# Patient Record
Sex: Female | Born: 1955 | Race: Black or African American | Hispanic: No | Marital: Married | Smoking: Never smoker
Health system: Southern US, Community
[De-identification: ages and names within clinical notes are randomized; demographics above are authoritative.]

## PROBLEM LIST (undated history)

## (undated) DIAGNOSIS — L309 Dermatitis, unspecified: Secondary | ICD-10-CM

## (undated) DIAGNOSIS — I10 Essential (primary) hypertension: Secondary | ICD-10-CM

## (undated) DIAGNOSIS — T7840XA Allergy, unspecified, initial encounter: Secondary | ICD-10-CM

## (undated) DIAGNOSIS — N938 Other specified abnormal uterine and vaginal bleeding: Secondary | ICD-10-CM

## (undated) DIAGNOSIS — E079 Disorder of thyroid, unspecified: Secondary | ICD-10-CM

## (undated) DIAGNOSIS — M810 Age-related osteoporosis without current pathological fracture: Secondary | ICD-10-CM

## (undated) DIAGNOSIS — M1990 Unspecified osteoarthritis, unspecified site: Secondary | ICD-10-CM

## (undated) HISTORY — DX: Dermatitis, unspecified: L30.9

## (undated) HISTORY — DX: Age-related osteoporosis without current pathological fracture: M81.0

## (undated) HISTORY — PX: TONSILLECTOMY: SHX5217

## (undated) HISTORY — DX: Other specified abnormal uterine and vaginal bleeding: N93.8

## (undated) HISTORY — PX: TUBAL LIGATION: SHX77

## (undated) HISTORY — DX: Unspecified osteoarthritis, unspecified site: M19.90

## (undated) HISTORY — DX: Allergy, unspecified, initial encounter: T78.40XA

---

## 1995-08-26 HISTORY — PX: TUBAL LIGATION: SHX77

## 1997-12-07 ENCOUNTER — Other Ambulatory Visit: Admission: RE | Admit: 1997-12-07 | Discharge: 1997-12-07 | Payer: Self-pay | Admitting: Emergency Medicine

## 1998-06-29 ENCOUNTER — Other Ambulatory Visit: Admission: RE | Admit: 1998-06-29 | Discharge: 1998-06-29 | Payer: Self-pay | Admitting: Family Medicine

## 1998-08-20 ENCOUNTER — Ambulatory Visit (HOSPITAL_COMMUNITY): Admission: RE | Admit: 1998-08-20 | Discharge: 1998-08-20 | Payer: Self-pay | Admitting: Family Medicine

## 1998-08-20 ENCOUNTER — Encounter: Payer: Self-pay | Admitting: Family Medicine

## 1999-08-28 ENCOUNTER — Encounter: Payer: Self-pay | Admitting: Family Medicine

## 1999-08-28 ENCOUNTER — Ambulatory Visit (HOSPITAL_COMMUNITY): Admission: RE | Admit: 1999-08-28 | Discharge: 1999-08-28 | Payer: Self-pay | Admitting: Family Medicine

## 2000-04-30 ENCOUNTER — Other Ambulatory Visit: Admission: RE | Admit: 2000-04-30 | Discharge: 2000-04-30 | Payer: Self-pay | Admitting: Family Medicine

## 2000-06-30 ENCOUNTER — Encounter: Admission: RE | Admit: 2000-06-30 | Discharge: 2000-06-30 | Payer: Self-pay | Admitting: Urology

## 2000-06-30 ENCOUNTER — Encounter: Payer: Self-pay | Admitting: Urology

## 2000-09-04 ENCOUNTER — Ambulatory Visit (HOSPITAL_COMMUNITY): Admission: RE | Admit: 2000-09-04 | Discharge: 2000-09-04 | Payer: Self-pay | Admitting: Family Medicine

## 2000-09-04 ENCOUNTER — Encounter: Payer: Self-pay | Admitting: Family Medicine

## 2001-09-13 ENCOUNTER — Encounter: Payer: Self-pay | Admitting: Family Medicine

## 2001-09-13 ENCOUNTER — Ambulatory Visit (HOSPITAL_COMMUNITY): Admission: RE | Admit: 2001-09-13 | Discharge: 2001-09-13 | Payer: Self-pay | Admitting: Family Medicine

## 2002-09-14 ENCOUNTER — Ambulatory Visit (HOSPITAL_COMMUNITY): Admission: RE | Admit: 2002-09-14 | Discharge: 2002-09-14 | Payer: Self-pay | Admitting: Family Medicine

## 2002-09-14 ENCOUNTER — Encounter: Payer: Self-pay | Admitting: Family Medicine

## 2003-09-18 ENCOUNTER — Ambulatory Visit (HOSPITAL_COMMUNITY): Admission: RE | Admit: 2003-09-18 | Discharge: 2003-09-18 | Payer: Self-pay | Admitting: Family Medicine

## 2004-08-25 HISTORY — PX: OTHER SURGICAL HISTORY: SHX169

## 2004-08-25 LAB — HM COLONOSCOPY: HM Colonoscopy: NORMAL

## 2004-09-18 ENCOUNTER — Ambulatory Visit (HOSPITAL_COMMUNITY): Admission: RE | Admit: 2004-09-18 | Discharge: 2004-09-18 | Payer: Self-pay | Admitting: Family Medicine

## 2004-10-08 ENCOUNTER — Other Ambulatory Visit: Admission: RE | Admit: 2004-10-08 | Discharge: 2004-10-08 | Payer: Self-pay | Admitting: Obstetrics and Gynecology

## 2005-02-18 ENCOUNTER — Encounter (INDEPENDENT_AMBULATORY_CARE_PROVIDER_SITE_OTHER): Payer: Self-pay | Admitting: *Deleted

## 2005-02-18 ENCOUNTER — Inpatient Hospital Stay (HOSPITAL_COMMUNITY): Admission: RE | Admit: 2005-02-18 | Discharge: 2005-02-20 | Payer: Self-pay | Admitting: Obstetrics and Gynecology

## 2005-09-22 ENCOUNTER — Ambulatory Visit (HOSPITAL_COMMUNITY): Admission: RE | Admit: 2005-09-22 | Discharge: 2005-09-22 | Payer: Self-pay | Admitting: Obstetrics and Gynecology

## 2005-10-15 ENCOUNTER — Other Ambulatory Visit: Admission: RE | Admit: 2005-10-15 | Discharge: 2005-10-15 | Payer: Self-pay | Admitting: Obstetrics and Gynecology

## 2006-08-25 HISTORY — PX: OTHER SURGICAL HISTORY: SHX169

## 2006-09-23 ENCOUNTER — Ambulatory Visit (HOSPITAL_COMMUNITY): Admission: RE | Admit: 2006-09-23 | Discharge: 2006-09-23 | Payer: Self-pay | Admitting: Obstetrics and Gynecology

## 2007-02-01 ENCOUNTER — Ambulatory Visit: Admission: RE | Admit: 2007-02-01 | Discharge: 2007-03-02 | Payer: Self-pay | Admitting: Radiation Oncology

## 2007-02-08 ENCOUNTER — Encounter (INDEPENDENT_AMBULATORY_CARE_PROVIDER_SITE_OTHER): Payer: Self-pay | Admitting: Specialist

## 2007-02-08 ENCOUNTER — Ambulatory Visit (HOSPITAL_BASED_OUTPATIENT_CLINIC_OR_DEPARTMENT_OTHER): Admission: RE | Admit: 2007-02-08 | Discharge: 2007-02-08 | Payer: Self-pay | Admitting: Specialist

## 2007-05-26 ENCOUNTER — Emergency Department (HOSPITAL_COMMUNITY): Admission: EM | Admit: 2007-05-26 | Discharge: 2007-05-26 | Payer: Self-pay | Admitting: Emergency Medicine

## 2007-06-03 ENCOUNTER — Ambulatory Visit: Payer: Self-pay | Admitting: Cardiology

## 2007-09-28 ENCOUNTER — Ambulatory Visit (HOSPITAL_COMMUNITY): Admission: RE | Admit: 2007-09-28 | Discharge: 2007-09-28 | Payer: Self-pay | Admitting: Obstetrics and Gynecology

## 2007-11-05 ENCOUNTER — Ambulatory Visit: Payer: Self-pay | Admitting: Family Medicine

## 2007-11-05 DIAGNOSIS — E039 Hypothyroidism, unspecified: Secondary | ICD-10-CM | POA: Insufficient documentation

## 2007-11-05 DIAGNOSIS — H538 Other visual disturbances: Secondary | ICD-10-CM | POA: Insufficient documentation

## 2007-11-05 DIAGNOSIS — J069 Acute upper respiratory infection, unspecified: Secondary | ICD-10-CM | POA: Insufficient documentation

## 2007-11-05 LAB — CONVERTED CEMR LAB
Albumin: 4 g/dL (ref 3.5–5.2)
Basophils Relative: 0 % (ref 0–1)
Creatinine, Ser: 1.03 mg/dL (ref 0.40–1.20)
Eosinophils Absolute: 0 10*3/uL (ref 0.0–0.7)
Eosinophils Relative: 0 % (ref 0–5)
HCT: 41.7 % (ref 36.0–46.0)
HDL: 59 mg/dL (ref >39)
Indirect Bilirubin: 0.2 mg/dL (ref 0.0–0.9)
MCHC: 33.1 g/dL (ref 30.0–36.0)
MCV: 81.9 fL (ref 78.0–100.0)
Monocytes Absolute: 0.7 10*3/uL (ref 0.1–1.0)
Potassium: 3.6 meq/L (ref 3.5–5.3)
RBC: 5.09 M/uL (ref 3.87–5.11)
RDW: 15.7 % — ABNORMAL HIGH (ref 11.5–15.5)
Sodium: 138 meq/L (ref 135–145)
Total Bilirubin: 0.3 mg/dL (ref 0.3–1.2)
VLDL: 18 mg/dL (ref 0–40)

## 2007-11-16 ENCOUNTER — Telehealth: Payer: Self-pay | Admitting: Family Medicine

## 2007-11-22 ENCOUNTER — Telehealth: Payer: Self-pay | Admitting: Family Medicine

## 2007-12-31 ENCOUNTER — Telehealth (INDEPENDENT_AMBULATORY_CARE_PROVIDER_SITE_OTHER): Payer: Self-pay | Admitting: *Deleted

## 2008-01-26 ENCOUNTER — Ambulatory Visit: Payer: Self-pay | Admitting: Family Medicine

## 2008-02-11 ENCOUNTER — Emergency Department (HOSPITAL_COMMUNITY): Admission: EM | Admit: 2008-02-11 | Discharge: 2008-02-11 | Payer: Self-pay | Admitting: Emergency Medicine

## 2008-02-17 ENCOUNTER — Telehealth: Payer: Self-pay | Admitting: *Deleted

## 2008-05-09 ENCOUNTER — Ambulatory Visit: Payer: Self-pay | Admitting: Family Medicine

## 2008-05-09 DIAGNOSIS — R059 Cough, unspecified: Secondary | ICD-10-CM | POA: Insufficient documentation

## 2008-05-09 DIAGNOSIS — R05 Cough: Secondary | ICD-10-CM

## 2008-05-17 DIAGNOSIS — N938 Other specified abnormal uterine and vaginal bleeding: Secondary | ICD-10-CM | POA: Insufficient documentation

## 2008-05-17 DIAGNOSIS — N949 Unspecified condition associated with female genital organs and menstrual cycle: Secondary | ICD-10-CM

## 2008-05-23 ENCOUNTER — Ambulatory Visit: Payer: Self-pay | Admitting: Family Medicine

## 2008-05-29 ENCOUNTER — Telehealth: Payer: Self-pay | Admitting: Family Medicine

## 2008-05-29 ENCOUNTER — Ambulatory Visit: Payer: Self-pay | Admitting: Family Medicine

## 2008-06-01 ENCOUNTER — Telehealth: Payer: Self-pay | Admitting: Family Medicine

## 2008-06-05 ENCOUNTER — Telehealth: Payer: Self-pay | Admitting: Family Medicine

## 2008-06-08 ENCOUNTER — Ambulatory Visit: Payer: Self-pay | Admitting: Family Medicine

## 2008-06-08 DIAGNOSIS — J309 Allergic rhinitis, unspecified: Secondary | ICD-10-CM | POA: Insufficient documentation

## 2008-06-08 DIAGNOSIS — J45909 Unspecified asthma, uncomplicated: Secondary | ICD-10-CM | POA: Insufficient documentation

## 2008-07-31 ENCOUNTER — Ambulatory Visit: Payer: Self-pay | Admitting: Family Medicine

## 2008-08-04 ENCOUNTER — Ambulatory Visit: Payer: Self-pay | Admitting: Family Medicine

## 2008-08-04 DIAGNOSIS — L509 Urticaria, unspecified: Secondary | ICD-10-CM | POA: Insufficient documentation

## 2008-08-29 ENCOUNTER — Ambulatory Visit: Payer: Self-pay | Admitting: Family Medicine

## 2008-08-29 DIAGNOSIS — R5381 Other malaise: Secondary | ICD-10-CM | POA: Insufficient documentation

## 2008-08-29 DIAGNOSIS — R5383 Other fatigue: Secondary | ICD-10-CM | POA: Insufficient documentation

## 2008-08-29 DIAGNOSIS — I10 Essential (primary) hypertension: Secondary | ICD-10-CM | POA: Insufficient documentation

## 2008-09-28 ENCOUNTER — Ambulatory Visit (HOSPITAL_COMMUNITY): Admission: RE | Admit: 2008-09-28 | Discharge: 2008-09-28 | Payer: Self-pay | Admitting: Obstetrics and Gynecology

## 2008-10-03 ENCOUNTER — Telehealth: Payer: Self-pay | Admitting: Family Medicine

## 2008-11-06 ENCOUNTER — Ambulatory Visit: Payer: Self-pay | Admitting: Family Medicine

## 2008-11-06 LAB — CONVERTED CEMR LAB
Alkaline Phosphatase: 60 units/L (ref 39–117)
BUN: 10 mg/dL (ref 6–23)
CO2: 26 meq/L (ref 19–32)
Calcium: 8.8 mg/dL (ref 8.4–10.5)
Cholesterol: 171 mg/dL (ref 0–200)
Creatinine, Ser: 0.8 mg/dL (ref 0.4–1.2)
Glucose, Bld: 81 mg/dL (ref 70–99)
HDL: 61.4 mg/dL (ref >39.0)
LDL Cholesterol: 92 mg/dL (ref 0–99)
Lymphocytes Relative: 34.2 % (ref 12.0–46.0)
Monocytes Relative: 10.5 % (ref 3.0–12.0)
Neutro Abs: 2.6 10*3/uL (ref 1.4–7.7)
Neutrophils Relative %: 53.2 % (ref 43.0–77.0)
Potassium: 3.2 meq/L — ABNORMAL LOW (ref 3.5–5.1)
RBC: 4.19 M/uL (ref 3.87–5.11)
RDW: 14.5 % (ref 11.5–14.6)
Sodium: 135 meq/L (ref 135–145)
TSH: 1.81 microintl units/mL (ref 0.35–5.50)
Triglycerides: 88 mg/dL (ref 0–149)
VLDL: 18 mg/dL (ref 0–40)
WBC: 4.8 10*3/uL (ref 4.5–10.5)

## 2008-11-13 ENCOUNTER — Ambulatory Visit: Payer: Self-pay | Admitting: Family Medicine

## 2008-12-07 ENCOUNTER — Telehealth: Payer: Self-pay | Admitting: Family Medicine

## 2008-12-14 ENCOUNTER — Ambulatory Visit: Payer: Self-pay | Admitting: Family Medicine

## 2009-02-02 ENCOUNTER — Ambulatory Visit: Payer: Self-pay | Admitting: Family Medicine

## 2009-02-02 DIAGNOSIS — R51 Headache: Secondary | ICD-10-CM | POA: Insufficient documentation

## 2009-02-02 DIAGNOSIS — R519 Headache, unspecified: Secondary | ICD-10-CM | POA: Insufficient documentation

## 2009-02-09 ENCOUNTER — Telehealth (INDEPENDENT_AMBULATORY_CARE_PROVIDER_SITE_OTHER): Payer: Self-pay | Admitting: *Deleted

## 2009-02-12 ENCOUNTER — Ambulatory Visit: Payer: Self-pay | Admitting: Family Medicine

## 2009-02-14 LAB — CONVERTED CEMR LAB
BUN: 12 mg/dL (ref 6–23)
CO2: 28 meq/L (ref 19–32)
Chloride: 105 meq/L (ref 96–112)
Creatinine, Ser: 0.8 mg/dL (ref 0.4–1.2)
GFR calc non Af Amer: 97.32 mL/min (ref >60)
Glucose, Bld: 90 mg/dL (ref 70–99)
Sodium: 140 meq/L (ref 135–145)

## 2009-03-08 ENCOUNTER — Ambulatory Visit: Payer: Self-pay | Admitting: Family Medicine

## 2009-03-30 ENCOUNTER — Telehealth: Payer: Self-pay | Admitting: Family Medicine

## 2009-08-08 ENCOUNTER — Telehealth: Payer: Self-pay | Admitting: Family Medicine

## 2009-10-02 ENCOUNTER — Ambulatory Visit (HOSPITAL_COMMUNITY): Admission: RE | Admit: 2009-10-02 | Discharge: 2009-10-02 | Payer: Self-pay | Admitting: Obstetrics and Gynecology

## 2009-10-09 ENCOUNTER — Encounter: Payer: Self-pay | Admitting: *Deleted

## 2009-11-08 ENCOUNTER — Telehealth: Payer: Self-pay | Admitting: Family Medicine

## 2009-11-14 ENCOUNTER — Ambulatory Visit: Payer: Self-pay | Admitting: Family Medicine

## 2009-11-14 LAB — CONVERTED CEMR LAB
ALT: 12 units/L (ref 0–35)
BUN: 9 mg/dL (ref 6–23)
Basophils Absolute: 0 10*3/uL (ref 0.0–0.1)
Basophils Relative: 0.3 % (ref 0.0–3.0)
Bilirubin Urine: NEGATIVE
Bilirubin, Direct: 0.1 mg/dL (ref 0.0–0.3)
Blood in Urine, dipstick: NEGATIVE
Calcium: 8.7 mg/dL (ref 8.4–10.5)
Cholesterol: 155 mg/dL (ref 0–200)
Eosinophils Absolute: 0.1 10*3/uL (ref 0.0–0.7)
Eosinophils Relative: 1 % (ref 0.0–5.0)
GFR calc non Af Amer: 113.19 mL/min (ref >60)
HCT: 34.3 % — ABNORMAL LOW (ref 36.0–46.0)
HDL: 67.8 mg/dL (ref >39.00)
Hemoglobin: 11.7 g/dL — ABNORMAL LOW (ref 12.0–15.0)
LDL Cholesterol: 75 mg/dL (ref 0–99)
Lymphs Abs: 2 10*3/uL (ref 0.7–4.0)
MCV: 88.1 fL (ref 78.0–100.0)
Neutro Abs: 3.8 10*3/uL (ref 1.4–7.7)
Neutrophils Relative %: 60 % (ref 43.0–77.0)
Nitrite: NEGATIVE
Specific Gravity, Urine: <1.005
Total Protein: 6.7 g/dL (ref 6.0–8.3)
Urobilinogen, UA: 0.2
WBC Urine, dipstick: NEGATIVE
WBC: 6.4 10*3/uL (ref 4.5–10.5)
pH: 7

## 2009-11-30 ENCOUNTER — Ambulatory Visit: Payer: Self-pay | Admitting: Family Medicine

## 2009-12-06 ENCOUNTER — Ambulatory Visit: Payer: Self-pay | Admitting: Internal Medicine

## 2009-12-17 ENCOUNTER — Telehealth: Payer: Self-pay | Admitting: Family Medicine

## 2010-01-28 ENCOUNTER — Telehealth: Payer: Self-pay | Admitting: Family Medicine

## 2010-06-06 ENCOUNTER — Ambulatory Visit: Payer: Self-pay | Admitting: Family Medicine
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
## 2010-07-26 ENCOUNTER — Ambulatory Visit: Payer: Self-pay | Admitting: Family Medicine

## 2010-08-02 ENCOUNTER — Encounter: Payer: Self-pay | Admitting: Cardiovascular Disease

## 2010-09-06 ENCOUNTER — Encounter
Admission: RE | Admit: 2010-09-06 | Discharge: 2010-09-06 | Payer: Self-pay | Source: Home / Self Care | Attending: Obstetrics and Gynecology | Admitting: Obstetrics and Gynecology

## 2010-09-26 NOTE — Progress Notes (Signed)
Summary: eye md?  Phone Note Call from Patient   Caller: Patient Call For: Roderick Pee MD Summary of Call: Pt wants a referral to a Opthmalogy today.  EYE is red and swollen.  Can we refer her? [phone] Initial call taken by: Lynann Beaver CMA,  December 17, 2009 9:51 AM  Follow-up for Phone Call        tim bevis Follow-up by: Roderick Pee MD,  December 17, 2009 10:01 AM  Additional Follow-up for Phone Call Additional follow up Details #1::        Pt. notified. Additional Follow-up by: Lynann Beaver CMA,  December 17, 2009 10:38 AM

## 2010-09-26 NOTE — Miscellaneous (Signed)
Summary: flu vaccine  Clinical Lists Changes  Observations: Added new observation of FLU VAX: Historical (10/08/2009 10:50)      Immunization History:  Influenza Immunization History:    Influenza:  historical (10/08/2009)

## 2010-09-26 NOTE — Letter (Signed)
Summary: Out of Work  [facility]  [address] [place], [place] [address]   Phone: [phone]  Fax: [phone]    December 06, 2009   Employee:  Jamie Mayer    To Whom It May Concern:   For Medical reasons, please excuse the above named employee from work for the following dates:  Start:   12/06/09  End:   12/10/09  If you need additional information, please feel free to contact our office.         Sincerely,    Etta Grandchild MD

## 2010-09-26 NOTE — Assessment & Plan Note (Signed)
Summary: ha/njr   Vital Signs:  Patient profile:   55 year old female O2 Sat:      99 % Temp:     98.6 degrees F Pulse rate:   73 / minute BP sitting:   120 / 80  (left arm)  Vitals Entered By: Pura Spice, RN (July 26, 2010 4:26 PM) CC: headache x 4 days feels like pressure   "air " in head and c/o feels like acid releasing  in stomach "    History of Present Illness: Here for 4 days of constant HAs which are centered around both eyes and the forehead. She has migraines, but Zomig has not been helping these HAs. She was nauseated a few days ago but not today. No vision changes or other neurologic changes. Also she has had pressure in the sinuses beneath her eyes for a week. No fever or ST or cough.      Allergies (verified): No Known Drug Allergies  Past History:  Past Medical History: Reviewed history from 02/02/2009 and no changes required. childbirth x 2 children are 16, and 29 BTL tonsillectomya fibroid removed keloids, treated with radiation therapy.  It was a scar from previous C-section colonoscopy for evaluation of rectal bleeding was negative and 2008 Allergic rhinitis Asthma DUB Headache  migraine pre. menst  Past Surgical History: Reviewed history from 12/06/2009 and no changes required. Denies surgical history  Review of Systems  The patient denies anorexia, fever, weight loss, weight gain, vision loss, decreased hearing, hoarseness, chest pain, syncope, dyspnea on exertion, peripheral edema, prolonged cough, hemoptysis, abdominal pain, melena, hematochezia, severe indigestion/heartburn, hematuria, incontinence, genital sores, muscle weakness, suspicious skin lesions, transient blindness, difficulty walking, depression, unusual weight change, abnormal bleeding, enlarged lymph nodes, angioedema, breast masses, and testicular masses.    Physical Exam  General:  Well-developed,well-nourished,in no acute distress; alert,appropriate and cooperative  throughout examination Head:  Normocephalic and atraumatic without obvious abnormalities. No apparent alopecia or balding. Eyes:  No corneal or conjunctival inflammation noted. EOMI. Perrla. Funduscopic exam benign, without hemorrhages, exudates or papilledema. Vision grossly normal. Ears:  External ear exam shows no significant lesions or deformities.  Otoscopic examination reveals clear canals, tympanic membranes are intact bilaterally without bulging, retraction, inflammation or discharge. Hearing is grossly normal bilaterally. Nose:  External nasal examination shows no deformity or inflammation. Nasal mucosa are pink and moist without lesions or exudates. Mouth:  Oral mucosa and oropharynx without lesions or exudates.  Teeth in good repair. Neck:  No deformities, masses, or tenderness noted. Chest Wall:  No deformities, masses, or tenderness noted. Lungs:  Normal respiratory effort, chest expands symmetrically. Lungs are clear to auscultation, no crackles or wheezes. Heart:  Normal rate and regular rhythm. S1 and S2 normal without gallop, murmur, click, rub or other extra sounds. Neurologic:  alert & oriented X3, cranial nerves II-XII intact, and gait normal.     Impression & Recommendations:  Problem # 1:  HEADACHE (ICD-784.0)  Her updated medication list for this problem includes:    Zomig Zmt 2.5 Mg Tbdp (Zolmitriptan) .Marland Kitchen... 1 sl stat @ onset of migraine    Vicodin 5-500 Mg Tabs (Hydrocodone-acetaminophen) .Marland Kitchen... 1 q 6 hours as needed pain  Problem # 2:  ALLERGIC RHINITIS (ICD-477.9)  Her updated medication list for this problem includes:    Flonase 50 Mcg/act Susp (Fluticasone propionate) .Marland Kitchen... 2 sprays each nostril once daily  Complete Medication List: 1)  Synthroid 88 Mcg Tabs (Levothyroxine sodium) .... One tab once daily  2)  Liquid Calcium/vitamins A & D 200-275-60 Mg-unt-unt Caps (Calcium-vitamin a-vitamin d) .... Once daily 3)  Lovaza 1 Gm Caps (Omega-3-acid ethyl esters)  .... Take one tab three times a day 4)  Lisinopril 40 Mg Tabs (Lisinopril) .Marland Kitchen.. 1 &1/2 qam 5)  Potassium 75 Mg Tabs (Potassium) .... Take 1 tablet by mouth two times a day 6)  Zomig Zmt 2.5 Mg Tbdp (Zolmitriptan) .Marland Kitchen.. 1 sl stat @ onset of migraine 7)  Hydrochlorothiazide 12.5 Mg Tabs (Hydrochlorothiazide) .... Take 1 tablet by mouth every morning 8)  Boniva 150 Mg Tabs (Ibandronate sodium) .... One tab every month 9)  Tobradex 0.3-0.1 % Oint (Tobramycin-dexamethasone) .... 2 gtts in each eye three times a day for 5 days then stop 10)  Hydromet 5-1.5 Mg/67ml Syrp (Hydrocodone-homatropine) .... 1/2 to 1 tsp at bedtime as needed 11)  Flonase 50 Mcg/act Susp (Fluticasone propionate) .... 2 sprays each nostril once daily 12)  Vicodin 5-500 Mg Tabs (Hydrocodone-acetaminophen) .Marland Kitchen.. 1 q 6 hours as needed pain  Patient Instructions: 1)  Use Mucinex and Flonase to reduce sinus congestion. These seem to be triggering a chronic migraine pattern. Add Vicodin as a rescue pain med if Zomig fails.  2)  Please schedule a follow-up appointment as needed .  Prescriptions: VICODIN 5-500 MG TABS (HYDROCODONE-ACETAMINOPHEN) 1 q 6 hours as needed pain  #30 x 0   Entered and Authorized by:   Nelwyn Salisbury MD   Signed by:   Nelwyn Salisbury MD on 07/26/2010   Method used:   Print then Give to Patient   RxID:   [id number] FLONASE 50 MCG/ACT SUSP (FLUTICASONE PROPIONATE) 2 sprays each nostril once daily  #30 x 11   Entered and Authorized by:   Nelwyn Salisbury MD   Signed by:   Nelwyn Salisbury MD on 07/26/2010   Method used:   Print then Give to Patient   RxID:   [id number]    Orders Added: 1)  Est. Patient Level IV [64[address]]

## 2010-09-26 NOTE — Assessment & Plan Note (Signed)
Summary: cough/njr   Vital Signs:  Patient profile:   55 year old female Weight:      169 pounds Pulse rate:   72 / minute Pulse rhythm:   regular BP sitting:   110 / 80  Vitals Entered By: Lynann Beaver CMA (June 06, 2010 4:02 PM) CC: cough Is Patient Diabetic? No Pain Assessment Patient in pain? no        Primary Care Provider:  Roderick Pee MD  CC:  cough.  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  Review of systems negative  Current Medications (verified): 1)  Synthroid 88 Mcg Tabs (Levothyroxine Sodium) .... One Tab Once Daily 2)  Liquid Calcium/vitamins A & D 200-275-60 Mg-Unt-Unt Caps (Calcium-Vitamin A-Vitamin D) .... Once Daily 3)  Lovaza 1 Gm Caps (Omega-3-Acid Ethyl Esters) .... Take One Tab Three Times A Day 4)  Lisinopril 40 Mg Tabs (Lisinopril) .Marland Kitchen.. 1 &1/2 Qam 5)  Potassium 75 Mg Tabs (Potassium) .... Take 1 Tablet By Mouth Two Times A Day 6)  Zomig Zmt 2.5 Mg Tbdp (Zolmitriptan) .Marland Kitchen.. 1 Sl Stat @ Onset of Migraine 7)  Hydrochlorothiazide 12.5 Mg Tabs (Hydrochlorothiazide) .... Take 1 Tablet By Mouth Every Morning 8)  Boniva 150 Mg Tabs (Ibandronate Sodium) .... One Tab Every Month 9)  Tobradex 0.3-0.1 % Oint (Tobramycin-Dexamethasone) .... 2 Gtts in Each Eye Three Times A Day For 5 Days Then Stop  Allergies (verified): No Known Drug Allergies  Past History:  Past medical, surgical, family and social histories (including risk factors) reviewed for relevance to current acute and chronic problems.  Past Medical History: Reviewed history from 02/02/2009 and no changes required. childbirth x 2 children are 17, and 29 BTL tonsillectomya fibroid removed keloids, treated with radiation therapy.  It was a scar from previous C-section colonoscopy for evaluation of rectal bleeding was negative and 2008 Allergic  rhinitis Asthma DUB Headache  migraine pre. menst  Past Surgical History: Reviewed history from 12/06/2009 and no changes required. Denies surgical history  Family History: Reviewed history from 11/05/2007 and no changes required. mother died at 21, renal failure from hypertension father died at 41, alcoholism  one brother are alcoholic and hypertensivetwo sisters, both hypertensive  Social History: Reviewed history from 11/05/2007 and no changes required. 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 No apparent alopecia or balding. Eyes:  No corneal or conjunctival inflammation noted. EOMI. Perrla. Funduscopic exam benign, without hemorrhages, exudates or papilledema. Vision grossly normal. Ears:  External ear exam shows no significant lesions or deformities.  Otoscopic examination reveals clear canals, tympanic membranes are intact bilaterally without bulging, retraction, inflammation or discharge. Hearing is grossly normal bilaterally. Nose:  External nasal examination shows no deformity or inflammation. Nasal mucosa are pink and moist without lesions or exudates. Mouth:  Oral mucosa and oropharynx without lesions or exudates.  Teeth in good repair. Neck:  No deformities, masses, or tenderness noted. Chest Wall:  No deformities, masses, or tenderness noted. Lungs:  Normal respiratory effort, chest expands symmetrically. Lungs are clear to auscultation, no crackles or wheezes.   Problems:  Medical Problems Added: 1)  Dx of Viral Infection-unspec  (ICD-079.99)  Impression & Recommendations:  Problem # 1:  VIRAL INFECTION-UNSPEC (ICD-079.99) Assessment New  Her  updated medication list for this problem includes:    Hydromet 5-1.5 Mg/32ml Syrp (Hydrocodone-homatropine) .Marland Kitchen... 1/2 to 1 tsp at bedtime as needed  Complete Medication List: 1)  Synthroid 88 Mcg Tabs (Levothyroxine sodium) .... One tab once daily 2)  Liquid Calcium/vitamins A & D 200-275-60 Mg-unt-unt Caps (Calcium-vitamin a-vitamin d) .... Once daily 3)  Lovaza 1 Gm Caps (Omega-3-acid ethyl esters) .... Take one tab three times a day 4)  Lisinopril 40 Mg Tabs (Lisinopril) .Marland Kitchen.. 1 &1/2 qam 5)  Potassium 75 Mg Tabs (Potassium) .... Take 1 tablet by mouth two times a day 6)  Zomig Zmt 2.5 Mg Tbdp (Zolmitriptan) .Marland Kitchen.. 1 sl stat @ onset of migraine 7)  Hydrochlorothiazide 12.5 Mg Tabs (Hydrochlorothiazide) .... Take 1 tablet by mouth every morning 8)  Boniva 150 Mg Tabs (Ibandronate sodium) .... One tab every month 9)  Tobradex 0.3-0.1 % Oint (Tobramycin-dexamethasone) .... 2 gtts in each eye three times a day for 5 days then stop 10)  Hydromet 5-1.5 Mg/15ml Syrp (Hydrocodone-homatropine) .... 1/2 to 1 tsp at bedtime as needed  Patient Instructions: 1)  drink  20 ounces of water daily. 2)  Hydromet one half to 1 teaspoon at bedtime p.r.n. for cough and cold Prescriptions: HYDROMET 5-1.5 MG/5ML SYRP (HYDROCODONE-HOMATROPINE) 1/2 to 1 tsp at bedtime as needed  #8oz x 1   Entered and Authorized by:   Roderick Pee MD   Signed by:   Roderick Pee MD on 06/06/2010   Method used:   Print then Give to Patient   RxID:   [id number]

## 2010-09-26 NOTE — Assessment & Plan Note (Signed)
Summary: cpx/no pap/njr   Vital Signs:  Patient profile:   55 year old female Height:      63 inches Weight:      164 pounds BMI:     29.16 Temp:     98.1 degrees F oral BP sitting:   124 / 84  (left arm) Cuff size:   regular  Vitals Entered By: Kern Reap CMA Duncan Dull) (November 30, 2009 4:00 PM) CC: cpx Is Patient Diabetic? No Pain Assessment Patient in pain? no        CC:  cpx.  History of Present Illness: Jamie Mayer is a 55 year old, married female, nonsmoker, who comes in today for evaluation of multiple issues.  She takes Synthroid 88 micrograms daily for hypothyroidism.  TSH is normal.  Continue above does  She takes BCPs via her GYN.  Since she is over 50.  Advised to stop him.  She takes lisinopril 60 mg daily and hydrocortisone Zaditor .5 mg daily for hypertension.  BP 124/84.  She also takes Zomig.  Stat at the onset of a migraine.  She's had about 6 this year.  All of which have been promptly relieved by taking the Zomig immediately.  She gets routine eye care to care for his BSE monthly and gets any mammography.  Colonoscopy normal.  Tetanus 2009 seasonal flu shot 2011.    She also takes boniva for osteoporosis.  Advise should only take it for 3 years and then get off.  Allergies (verified): No Known Drug Allergies  Past History:  Past medical, surgical, family and social histories (including risk factors) reviewed, and no changes noted (except as noted below).  Past Medical History: Reviewed history from 02/02/2009 and no changes required. childbirth x 2 children are 17, and 29 BTL tonsillectomya fibroid removed keloids, treated with radiation therapy.  It was a scar from previous C-section colonoscopy for evaluation of rectal bleeding was negative and 2008 Allergic rhinitis Asthma DUB Headache  migraine pre. menst  Family History: Reviewed history from 11/05/2007 and no changes required. mother died at 12, renal failure from hypertension father  died at 39, alcoholism  one brother are alcoholic and hypertensivetwo sisters, both hypertensive  Social History: Reviewed history from 11/05/2007 and no changes required. 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 No apparent alopecia or balding. Eyes:  No corneal or conjunctival inflammation noted. EOMI. Perrla. Funduscopic exam benign, without hemorrhages, exudates or papilledema. Vision grossly normal. Ears:  External ear exam shows no significant lesions or deformities.  Otoscopic examination reveals clear canals, tympanic membranes are intact bilaterally without bulging, retraction, inflammation or discharge. Hearing is grossly normal bilaterally. Nose:  External nasal examination shows no deformity or inflammation. Nasal mucosa are pink and moist without lesions or exudates. Mouth:  Oral mucosa and oropharynx without lesions or exudates.  Teeth in good repair. Neck:  No deformities, masses, or tenderness noted. Chest Wall:  No deformities, masses, or tenderness noted. Breasts:  No mass, nodules, thickening, tenderness, bulging, retraction, inflamation, nipple discharge or skin changes noted.   Lungs:  Normal respiratory effort, chest expands symmetrically. Lungs are clear to auscultation, no crackles or wheezes. Heart:  Normal rate and regular rhythm. S1 and S2 normal without gallop, murmur, click, rub or other extra  sounds. Abdomen:  Bowel sounds positive,abdomen soft and non-tender without masses, organomegaly or hernias noted. Msk:  No deformity or scoliosis noted of thoracic or lumbar spine.   Pulses:  R and L carotid,radial,femoral,dorsalis pedis and posterior tibial  pulses are full and equal bilaterally Extremities:  No clubbing, cyanosis, edema, or deformity noted with normal full range of motion of all joints.   Neurologic:  No cranial nerve deficits noted. Station and gait are normal. Plantar reflexes are down-going bilaterally. DTRs are symmetrical throughout. Sensory, motor and coordinative functions appear intact. Skin:  total body skin exam normal, except she has some keloids.  Left chest and in the pubic area, where she had the fibroids removed from her uterus Cervical Nodes:  No lymphadenopathy noted Axillary Nodes:  No palpable lymphadenopathy Inguinal Nodes:  No significant adenopathy Psych:  Cognition and judgment appear intact. Alert and cooperative with normal attention span and concentration. No apparent delusions, illusions, hallucinations   Impression & Recommendations:  Problem # 1:  HEADACHE (ICD-784.0) Assessment Improved  Her updated medication list for this problem includes:    Adult Aspirin Ec Low Strength 81 Mg Tbec (Aspirin) ..... Once daily    Zomig Zmt 2.5 Mg Tbdp (Zolmitriptan) .Marland Kitchen... 1 sl stat @ onset of migraine  Orders: Prescription Created Electronically [phone])  Problem # 2:  HYPERTENSION NEC (ICD-997.91) Assessment: Improved  Orders: Prescription Created Electronically [id number]) EKG w/ Interpretation (93000)  Problem # 3:  ALLERGIC RHINITIS (ICD-477.9) Assessment: Improved  Orders: Prescription Created Electronically [id number])  Problem # 4:  UNSPECIFIED HYPOTHYROIDISM (ICD-244.9) Assessment: Improved  Her updated medication list for this problem includes:    Synthroid 88 Mcg Tabs (Levothyroxine sodium) ..... One tab once daily  Orders: Prescription Created Electronically [phone])  Problem # 5:  Preventive Health Care (ICD-V70.0) Assessment: Unchanged  Complete Medication List: 1)  Synthroid 88 Mcg Tabs (Levothyroxine sodium) .... One tab once daily 2)  Zovia 1/35e (28) 1-35 Mg-mcg Tabs (Ethynodiol  diac-eth estradiol) .... Uad 3)  Liquid Calcium/vitamins A & D 200-275-60 Mg-unt-unt Caps (Calcium-vitamin a-vitamin d) .... Once daily 4)  Lovaza 1 Gm Caps (Omega-3-acid ethyl esters) .... Take one tab three times a day 5)  Adult Aspirin Ec Low Strength 81 Mg Tbec (Aspirin) .... Once daily 6)  Lisinopril 40 Mg Tabs (Lisinopril) .Marland Kitchen.. 1 &1/2 qam 7)  Potassium 75 Mg Tabs (Potassium) 8)  Zomig Zmt 2.5 Mg Tbdp (Zolmitriptan) .Marland Kitchen.. 1 sl stat @ onset of migraine 9)  Hydrochlorothiazide 12.5 Mg Tabs (Hydrochlorothiazide) .... Take 1 tablet by mouth every morning 10)  Boniva 150 Mg Tabs (Ibandronate sodium) .... One tab every month  Patient Instructions: 1)  It is important that you exercise regularly at least 20 minutes 5 times a week. If you develop chest pain, have severe difficulty breathing, or feel very tired , stop exercising immediately and seek medical attention. 2)  Schedule your mammogram. 3)  Schedule a colonoscopy/sigmoidoscopy to help detect colon cancer. 4)  Take calcium +Vitamin D daily. 5)  stopped the birth control pills, and the aspirin. 6)  Please schedule a follow-up appointment in 1 year. Prescriptions: HYDROCHLOROTHIAZIDE 12.5 MG TABS (HYDROCHLOROTHIAZIDE) Take 1 tablet by mouth every morning  #100 x 3   Entered and Authorized by:   Roderick Pee MD   Signed by:   Roderick Pee MD on 11/30/2009   Method used:   Electronically to        [facility] [address] (retail)  [address]       [place], [place]  [address]       Ph: [phone] or [phone]       Fax: [phone]   RxID:   [id number] ZOMIG ZMT 2.5 MG TBDP (ZOLMITRIPTAN) 1 SL stat @ onset of migraine  #6 x 11   Entered and Authorized by:   Roderick Pee MD   Signed by:   Roderick Pee MD on 11/30/2009   Method used:   Electronically to        [facility] [address] (retail)       [address]       [place], [place]  [address]       Ph: [phone] or [phone]       Fax: [phone]   RxID:   [id number] LISINOPRIL 40 MG TABS (LISINOPRIL) 1 &1/2 qam  #150 x 3   Entered and Authorized by:   Roderick Pee MD   Signed by:   Roderick Pee MD on 11/30/2009   Method used:   Electronically to        [facility] [address] (retail)       [address]       [place], [place]  [address]       Ph: [phone] or [phone]       Fax: [phone]   RxID:   [id number] SYNTHROID 49 MCG TABS (LEVOTHYROXINE SODIUM) one tab once daily  #100 x 3   Entered and Authorized by:   Roderick Pee MD   Signed by:   Roderick Pee MD on 11/30/2009   Method used:   Electronically to        [facility] [address] (retail)       [address]       [place], [place]  [address]       Ph: [phone] or [phone]       Fax: [phone]   RxID:   [id number]

## 2010-09-26 NOTE — Progress Notes (Signed)
Summary: questions about iron supplements  Phone Note Call from Patient   Caller: Patient Call For: Roderick Pee MD Summary of Call: [phone] Pt is taking iron 325 mg. one by mouth daily, and wants to know how long she should stay on this? Initial call taken by: Lynann Beaver CMA,  January 28, 2010 11:10 AM  Follow-up for Phone Call        for a total of 4 months  Pt notified. Lynann Beaver CMA  January 28, 2010 1:25 PM  Follow-up by: Roderick Pee MD,  January 28, 2010 1:10 PM

## 2010-09-26 NOTE — Progress Notes (Signed)
Summary: paperwork & sampleLMTCB 3-17  Phone Note Call from Patient Call back at Home Phone [phone]   Summary of Call: Wants to bring paperwork to see if Dr. Karie Schwalbe can make an adjustment in my med & requests samples Boniva.   Initial call taken by: Rudy Jew, RN,  November 08, 2009 12:08 PM  Follow-up for Phone Call        scheduled for the week.  I get back........ no samples available Follow-up by: Roderick Pee MD,  November 08, 2009 12:55 PM  Additional Follow-up for Phone Call Additional follow up Details #1::        Left message to call back. Rudy Jew, RN  November 08, 2009 1:26 PM     Additional Follow-up for Phone Call Additional follow up Details #2::    Pt notified. Follow-up by: Lynann Beaver CMA,  November 08, 2009 4:12 PM

## 2010-09-26 NOTE — Assessment & Plan Note (Signed)
Summary: Jamie Mayer   Vital Signs:  Patient profile:   55 year old female Height:      63 inches Weight:      166.50 pounds BMI:     29.60 O2 Sat:      97 % on Room air Temp:     98.4 degrees F oral Pulse rate:   66 / minute Pulse rhythm:   regular Resp:     16 per minute BP sitting:   134 / 86  (left arm) Cuff size:   large  Vitals Entered By: Rock Nephew CMA (December 06, 2009 8:57 AM)  Nutrition Counseling: Patient's BMI is greater than 25 and therefore counseled on weight management options.  O2 Flow:  Room air  Primary Care Provider:  Roderick Pee MD  CC:  Red eye.  History of Present Illness:  Red Eye      This is a 55 year old woman who presents with Red eye.  The symptoms began 12-24 hrs ago.  The severity is described as mild.  The patient reports redness of both eyes, but denies pain, blurring of vision, loss of vision, photophobia, discharge, swelling, foreign body sensation, and itching.  The patient denies the following risk factors: foreign body in eye, history of glaucoma, FH of glaucoma, recent chickenpox infection, prior chicken pox infection, contact lens usage, recent eye surgery, PMH of inflammatory bowel disease, and PMH sarcoidosis.  Precipitating events include no prior incident.    Current Medications (verified): 1)  Synthroid 88 Mcg Tabs (Levothyroxine Sodium) .... One Tab Once Daily 2)  Liquid Calcium/vitamins A & D 200-275-60 Mg-Unt-Unt Caps (Calcium-Vitamin A-Vitamin D) .... Once Daily 3)  Lovaza 1 Gm Caps (Omega-3-Acid Ethyl Esters) .... Take One Tab Three Times A Day 4)  Lisinopril 40 Mg Tabs (Lisinopril) .Marland Kitchen.. 1 &1/2 Qam 5)  Potassium 75 Mg Tabs (Potassium) .... Take 1 Tablet By Mouth Two Times A Day 6)  Zomig Zmt 2.5 Mg Tbdp (Zolmitriptan) .Marland Kitchen.. 1 Sl Stat @ Onset of Migraine 7)  Hydrochlorothiazide 12.5 Mg Tabs (Hydrochlorothiazide) .... Take 1 Tablet By Mouth Every Morning 8)  Boniva 150 Mg Tabs (Ibandronate Sodium) .... One Tab Every  Month  Allergies (verified): No Known Drug Allergies  Past History:  Past Medical History: Reviewed history from 02/02/2009 and no changes required. childbirth x 2 children are 43, and 29 BTL tonsillectomya fibroid removed keloids, treated with radiation therapy.  It was a scar from previous C-section colonoscopy for evaluation of rectal bleeding was negative and 2008 Allergic rhinitis Asthma DUB Headache  migraine pre. menst  Past Surgical History: Denies surgical history  Family History: Reviewed history from 11/05/2007 and no changes required. mother died at 73, renal failure from hypertension father died at 87, alcoholism  one brother are alcoholic and hypertensivetwo sisters, both hypertensive  Social History: Reviewed history from 11/05/2007 and no changes required. Occupation:  Media planner, [employer] Married Never Smoked Alcohol use-no Drug use-no Regular exercise-yes  Review of Systems  The patient denies fever, prolonged cough, headaches, suspicious skin lesions, and enlarged lymph nodes.   General:  Denies chills, fatigue, fever, malaise, and sweats. ENT:  Denies decreased hearing, earache, hoarseness, nasal congestion, postnasal drainage, ringing in ears, sinus pressure, and sore throat.  Physical Exam  General:  Well-developed,well-nourished,in no acute distress; alert,appropriate and cooperative throughout examination Head:  Normocephalic and atraumatic without obvious abnormalities. No apparent alopecia or balding. Eyes:  conjunctival injection in both eyes, it is mild/symmetrical/peripheral, vision grossly  intact, pupils equal, pupils round, pupils reactive to light, pupils react to accomodation, corneas and lenses clear, no iris abnormalities, no nystagmus, and conjunctival injection.   Nose:  External nasal examination shows no deformity or inflammation. Nasal mucosa are Jamie and moist without lesions or exudates. Mouth:  Oral mucosa  and oropharynx without lesions or exudates.  Teeth in good repair. Neck:  No deformities, masses, or tenderness noted. Lungs:  Normal respiratory effort, chest expands symmetrically. Lungs are clear to auscultation, no crackles or wheezes. Heart:  Normal rate and regular rhythm. S1 and S2 normal without gallop, murmur, click, rub or other extra sounds. Abdomen:  Bowel sounds positive,abdomen soft and non-tender without masses, organomegaly or hernias noted. Msk:  No deformity or scoliosis noted of thoracic or lumbar spine.   Pulses:  R and L carotid,radial,femoral,dorsalis pedis and posterior tibial pulses are full and equal bilaterally Extremities:  No clubbing, cyanosis, edema, or deformity noted with normal full range of motion of all joints.   Neurologic:  No cranial nerve deficits noted. Station and gait are normal. Plantar reflexes are down-going bilaterally. DTRs are symmetrical throughout. Sensory, motor and coordinative functions appear intact. Skin:  turgor normal, color normal, no rashes, no suspicious lesions, no ecchymoses, no petechiae, no purpura, no ulcerations, and no edema.   Cervical Nodes:  no anterior cervical adenopathy and no posterior cervical adenopathy.   Psych:  Cognition and judgment appear intact. Alert and cooperative with normal attention span and concentration. No apparent delusions, illusions, hallucinations   Impression & Recommendations:  Problem # 1:  CONJUNCTIVITIS (ICD-372.30) Assessment New  try tobradex for infection and symptom relief  Discussed treatment, and urged patient to wash hands carefully after touching face.   Complete Medication List: 1)  Synthroid 88 Mcg Tabs (Levothyroxine sodium) .... One tab once daily 2)  Liquid Calcium/vitamins A & D 200-275-60 Mg-unt-unt Caps (Calcium-vitamin a-vitamin d) .... Once daily 3)  Lovaza 1 Gm Caps (Omega-3-acid ethyl esters) .... Take one tab three times a day 4)  Lisinopril 40 Mg Tabs (Lisinopril) .Marland Kitchen..  1 &1/2 qam 5)  Potassium 75 Mg Tabs (Potassium) .... Take 1 tablet by mouth two times a day 6)  Zomig Zmt 2.5 Mg Tbdp (Zolmitriptan) .Marland Kitchen.. 1 sl stat @ onset of migraine 7)  Hydrochlorothiazide 12.5 Mg Tabs (Hydrochlorothiazide) .... Take 1 tablet by mouth every morning 8)  Boniva 150 Mg Tabs (Ibandronate sodium) .... One tab every month 9)  Tobradex 0.3-0.1 % Oint (Tobramycin-dexamethasone) .... 2 gtts in each eye three times a day for 5 days then stop  Patient Instructions: 1)  Please schedule a follow-up appointment in 2 weeks. 2)  Clean any discharge from eyelids with baby shampoo and warm water. Be sure to wash your hands often  to avoid spreading and reinfection. If you wear contacts, remove them and wear glasses until infection resolved (be sure and clean lenses before replacing).  Prescriptions: TOBRADEX 0.3-0.1 % OINT (TOBRAMYCIN-DEXAMETHASONE) 2 GTTS IN [facility] three times a day FOR 5 DAYS THEN STOP  #10 ml x 0   Entered and Authorized by:   Etta Grandchild MD   Signed by:   Etta Grandchild MD on 12/06/2009   Method used:   Print then Give to Patient   RxID:   [id number]

## 2010-10-08 ENCOUNTER — Encounter: Payer: Self-pay | Admitting: Obstetrics and Gynecology

## 2010-10-08 ENCOUNTER — Other Ambulatory Visit: Payer: Self-pay | Admitting: Family Medicine

## 2010-10-08 DIAGNOSIS — E039 Hypothyroidism, unspecified: Secondary | ICD-10-CM

## 2010-12-03 ENCOUNTER — Other Ambulatory Visit: Payer: Self-pay

## 2010-12-03 ENCOUNTER — Other Ambulatory Visit (INDEPENDENT_AMBULATORY_CARE_PROVIDER_SITE_OTHER): Payer: BC Managed Care – PPO | Admitting: Family Medicine

## 2010-12-03 DIAGNOSIS — Z0000 Encounter for general adult medical examination without abnormal findings: Secondary | ICD-10-CM

## 2010-12-03 LAB — HEPATIC FUNCTION PANEL
AST: 19 U/L (ref 0–37)
Alkaline Phosphatase: 54 U/L (ref 39–117)
Bilirubin, Direct: 0.1 mg/dL (ref 0.0–0.3)
Total Bilirubin: 0.7 mg/dL (ref 0.3–1.2)

## 2010-12-03 LAB — BASIC METABOLIC PANEL
BUN: 16 mg/dL (ref 6–23)
CO2: 29 mEq/L (ref 19–32)
Chloride: 99 mEq/L (ref 96–112)
Creatinine, Ser: 0.9 mg/dL (ref 0.4–1.2)
Sodium: 136 mEq/L (ref 135–145)

## 2010-12-03 LAB — POCT URINALYSIS DIPSTICK
Glucose, UA: NEGATIVE
Leukocytes, UA: NEGATIVE
Spec Grav, UA: 1.01
Urobilinogen, UA: 0.2

## 2010-12-03 LAB — CBC WITH DIFFERENTIAL/PLATELET
Basophils Absolute: 0 10*3/uL (ref 0.0–0.1)
Hemoglobin: 13.2 g/dL (ref 12.0–15.0)
Lymphs Abs: 1.6 10*3/uL (ref 0.7–4.0)
MCHC: 34.1 g/dL (ref 30.0–36.0)
MCV: 86.2 fl (ref 78.0–100.0)
Monocytes Relative: 10 % (ref 3.0–12.0)
Neutro Abs: 2.7 10*3/uL (ref 1.4–7.7)
RBC: 4.49 Mil/uL (ref 3.87–5.11)
RDW: 16.3 % — ABNORMAL HIGH (ref 11.5–14.6)
WBC: 4.9 10*3/uL (ref 4.5–10.5)

## 2010-12-03 LAB — LIPID PANEL
LDL Cholesterol: 103 mg/dL — ABNORMAL HIGH (ref 0–99)
Total CHOL/HDL Ratio: 3
Triglycerides: 56 mg/dL (ref 0.0–149.0)
VLDL: 11.2 mg/dL (ref 0.0–40.0)

## 2010-12-03 LAB — TSH: TSH: 0.82 u[IU]/mL (ref 0.35–5.50)

## 2010-12-10 ENCOUNTER — Ambulatory Visit (INDEPENDENT_AMBULATORY_CARE_PROVIDER_SITE_OTHER): Payer: BC Managed Care – PPO | Admitting: Family Medicine

## 2010-12-10 ENCOUNTER — Encounter: Payer: Self-pay | Admitting: Family Medicine

## 2010-12-10 DIAGNOSIS — E039 Hypothyroidism, unspecified: Secondary | ICD-10-CM

## 2010-12-10 DIAGNOSIS — N949 Unspecified condition associated with female genital organs and menstrual cycle: Secondary | ICD-10-CM

## 2010-12-10 DIAGNOSIS — IMO0002 Reserved for concepts with insufficient information to code with codable children: Secondary | ICD-10-CM

## 2010-12-10 DIAGNOSIS — I10 Essential (primary) hypertension: Secondary | ICD-10-CM

## 2010-12-10 DIAGNOSIS — N938 Other specified abnormal uterine and vaginal bleeding: Secondary | ICD-10-CM

## 2010-12-10 MED ORDER — HYDROCHLOROTHIAZIDE 12.5 MG PO CAPS: 12.5000 mg | ORAL_CAPSULE | Freq: Every day | ORAL | Status: DC

## 2010-12-10 MED ORDER — LISINOPRIL 40 MG PO TABS: 40.0000 mg | ORAL_TABLET | Freq: Every day | ORAL | Status: DC

## 2010-12-10 MED ORDER — LEVOTHYROXINE SODIUM 88 MCG PO TABS: 88.0000 ug | ORAL_TABLET | Freq: Every day | ORAL | Status: DC

## 2010-12-10 NOTE — Patient Instructions (Signed)
Continue current blood pressure and thyroid medications.  Remember to drink lots of water, take 4 ounces of turned his or 3 tablespoons of milk of magnesia twice daily.  Re- consult with Dr. Milton Ferguson concerning hot flushes.  Return to see [place] yearly for annual checkup

## 2010-12-10 NOTE — Progress Notes (Signed)
  Subjective:    Patient ID: Jamie Mayer, female    DOB: 1956-05-16, 55 y.o.   MRN: [id number]  [name] is a 55 year old, married female, nonsmoker, who comes in today for an evaluation of hypertension, and hypothyroidism.  Her hypertension was treated with hydrochlorothiazide 12.5 mg daily and lisinopril 40 daily.  BP 120/86 at home.  She takes Synthroid 88 mcg daily for hypothyroidism TSH is normal.  Continue above dose.  She is still actively having monthly periods and has a lot of menopausal symptoms.  Recommended she see Dr. Milton Ferguson for questions concerning the treatment of that.  She's tried over-the-counter medications, but nothing seems to help.  Because of the hormonal changes is triggered her migraine headaches for which she takes Excedrin Migraine and it seems to help.  Dr. Milton Ferguson as also diagnosed her to have osteoporosis as her on calcium, vitamin D, and then even 150 mg monthly to  She does not get routine eye care.  Recommend Dr. Vonna Kotyk.  She has retained dental care does BSE monthly.  Gets any mammography.  Colonoscopy 2007 normal tetanus 2009 .  She is also seeing Dr. Zachery Dakins the general surgeon.  She has a combination of internal and external hemorrhoids.  Planning on having surgery because medical therapy has failed    Review of Systems  Constitutional: Negative.   HENT: Negative.   Eyes: Negative.   Respiratory: Negative.   Cardiovascular: Negative.   Gastrointestinal: Negative.   Genitourinary: Negative.   Musculoskeletal: Negative.   Neurological: Negative.   Hematological: Negative.   Psychiatric/Behavioral: Negative.        Objective:   Physical Exam  Constitutional: She appears well-developed and well-nourished.  HENT:  Head: Normocephalic and atraumatic.  Right Ear: External ear normal.  Left Ear: External ear normal.  Nose: Nose normal.  Mouth/Throat: Oropharynx is clear and moist.  Eyes: EOM are normal. Pupils are equal, round, and  reactive to light.  Neck: Normal range of motion. Neck supple. No thyromegaly present.  Cardiovascular: Normal rate, regular rhythm, normal heart sounds and intact distal pulses.  Exam reveals no gallop and no friction rub.   No murmur heard. Pulmonary/Chest: Effort normal and breath sounds normal.  Abdominal: Soft. Bowel sounds are normal. She exhibits no distension and no mass. There is no tenderness. There is no rebound.  Musculoskeletal: Normal range of motion.  Lymphadenopathy:    She has no cervical adenopathy.  Neurological: She is alert. She has normal reflexes. No cranial nerve deficit. She exhibits normal muscle tone. Coordination normal.  Skin: Skin is warm and dry.  Psychiatric: She has a normal mood and affect. Her behavior is normal. Judgment and thought content normal.          Assessment & Plan:  Hypertension continue medications.  Hypothyroidism.  Continue medications.  Perimenopausal symptoms.  Refer back to GYN.  Osteoporosis.  Continue calcium, vitamin D, and the knee.  The and check studies report.  Recurrent hemorrhoidal bleeding.  Refer back to general surgery.  Migraine headaches.  Continue migraine Excedrin

## 2010-12-25 ENCOUNTER — Other Ambulatory Visit: Payer: Self-pay | Admitting: Obstetrics and Gynecology

## 2010-12-25 DIAGNOSIS — N644 Mastodynia: Secondary | ICD-10-CM

## 2010-12-25 DIAGNOSIS — N6009 Solitary cyst of unspecified breast: Secondary | ICD-10-CM

## 2010-12-26 ENCOUNTER — Other Ambulatory Visit: Payer: Self-pay | Admitting: Obstetrics and Gynecology

## 2010-12-26 ENCOUNTER — Ambulatory Visit
Admission: RE | Admit: 2010-12-26 | Discharge: 2010-12-26 | Disposition: A | Discharge status: Home / Self Care | Payer: BC Managed Care – PPO | Source: Ambulatory Visit | Attending: Obstetrics and Gynecology | Admitting: Obstetrics and Gynecology

## 2010-12-26 DIAGNOSIS — N6009 Solitary cyst of unspecified breast: Secondary | ICD-10-CM

## 2010-12-26 DIAGNOSIS — N644 Mastodynia: Secondary | ICD-10-CM

## 2011-01-07 NOTE — Op Note (Signed)
NAMEAGAPITA, SAVARINO              ACCOUNT NO.:  [id number]   MEDICAL RECORD NO.:  [id number]          PATIENT TYPE:  AMB   LOCATION:  DSC                          FACILITY:  [facility]   PHYSICIAN:  Earvin Hansen L. Truesdale, M.D.DATE OF BIRTH:  1956-04-12   DATE OF PROCEDURE:  02/08/2007  DATE OF DISCHARGE:                               OPERATIVE REPORT   PREOPERATIVE DIAGNOSIS:  This is a 55 year old lady with severe keloid  about her lower pubic area, increased pain and discomfort, burning and  itching.  She has not been responsive to conservative treatment,  including steroid creams, and also injections of steroids of different  types.   PROCEDURE PLANNED:  Wide excision and plastic reconstruction of the area  which measures over 8 inches in length and approximately 2-3 inches in  greatest width.   PROCEDURE IN DETAIL:  The patient was taken to the operating room and  placed on the operating room table in the supine position.  She was  given adequate general anesthesia and intubated orally.  Prep was done  to the abdominal, groin and pubic areas with Hibiclens soap and  solution, walled off with sterile towels and drapes to make a sterile  field.  A marking pen was used to outline the total dimensions of the  keloid.  Then, 0.5% Xylocaine with epinephrine was injected locally of a  1:200,000 concentration, a total of 50 mL.  This was allowed to setup  for vasoconstriction.  Next, incision of the keloid was done to the deep  subcutaneous tissue.  Next, the superior flaps and inferior flaps were  freed up using the Bovie unit on coagulations of approximately 4-6 cm,  so as to allow plastic closure and flaps with 2-0 Monocryl x2 layers in  the deep subcutaneous plane, a subdermal suture of 3-0 Monocryl and then  a running subcuticular stitch of 3-0 Monocryl.  The wounds were  cleansed.  A sterile dressing was applied to all of the areas, including  Xeroform, 4 x 4's, ABDs, and Hypafix  tape.   She withstood the procedures very well.  The patient is now being  prepared for postoperative low-dose irradiation to be done at [facility].  She will have 2 treatments total.  I will see her back in  the office in approximately 1 week.  She will call me for any medical  problems.      Yaakov Guthrie. Shon Hough, M.D.  Electronically Signed     GLT/MEDQ  D:  02/08/2007  T:  02/08/2007  Job:  [id number]

## 2011-01-07 NOTE — Assessment & Plan Note (Signed)
[facility]                            CARDIOLOGY OFFICE NOTE   Jamie Mayer, Jamie Mayer                     MRN:          [id number]  DATE:06/03/2007                            DOB:          March 15, 1956    REFERRING PHYSICIAN:  Doug Sou, M.D.   PRIMARY CARE PHYSICIAN:  Michelle L. Vincente Poli, M.D.   REASON FOR CONSULTATION:  Evaluate patient with arm pain.   HISTORY OF PRESENT ILLNESS:  The patient presents for followup of left  arm pain.  She was recently in both urgent care and the emergency room  for arm discomfort.  This started suddenly one day.  It is a left arm  discomfort.  She describes it as a constant throbbing pressure like pain  and then points to her upper arm.  It is in her biceps.  It does not  seem to be positional.  It seems to wax and wane but it has been  relatively constant.  She states she has also had some weakness in this  arm.  The day before it happened she had gone to the gym but does not  remember any trauma or injury related to that.  In the emergency room  and urgent care, she does not recall any imaging studies.  She did have  a D-dimer which was normal.  She was given diclofenac without  improvement in these symptoms.  She is not describing any substernal  chest pressure or neck discomfort.  She has not had any shortness of  breath and denies any PND or orthopnea.  She has had no palpitations,  presyncope, or syncope.  She says prior to this she had been active  without any symptoms.  She does have some apparent lumbar spine  arthritis.   PAST MEDICAL HISTORY:  1. Hypothyroidism.  2. Arthritis of the lower back.  3. Hypertension recently diagnosed in the ER.   PAST SURGICAL HISTORY:  1. Tonsillectomy.  2. Fibroids removed.  3. Keloids removed.   ALLERGIES:  None.   MEDICATIONS:  1. Synthroid 0.05 mg daily.  2. Diclofenac 75 mg daily.  3. Lisinopril 20 mg daily.  4. Multivitamin.  5. Caltrate.  6.  Glucosamine.  7. Chondroitin.   SOCIAL HISTORY:  The patient is a Architectural technologist.  She is married.  She has 2 children and 2 grandchildren.  She does not smoke cigarettes  or drink alcohol.   FAMILY HISTORY:  Noncontributory for early coronary artery disease.   REVIEW OF SYSTEMS:  As stated in the HPI.  Positive for constipation.  Negative for other systems.   PHYSICAL EXAMINATION:  GENERAL:  The patient is in no distress.  VITAL SIGNS:  Blood pressure 124/92, heart rate 66 and regular.  HEENT:  Eyelids unremarkable.  Pupils are equal, round, and reactive to  light.  Fundi not visualized.  Oral mucosa unremarkable.  NECK:  No jugular venous distention at 45 degrees.  Carotid upstroke  brisk and symmetric.  No bruits.  No thyromegaly.  LYMPHATICS:  No cervical, axillary, or inguinal adenopathy.  LUNGS:  Clear to auscultation bilaterally.  BACK:  No costovertebral angle tenderness.  CHEST:  Unremarkable.  HEART:  PMI not displaced or sustained.  S1 and S2 within normal limits.  No S3, no S4, no clicks, no rubs, no murmurs.  ABDOMEN:  Flat.  Positive bowel sounds, normal in frequency and pitch.  No bruits.  No rebound.  No guarding.  No midline pulsatile mass.  No  hepatomegaly.  No splenomegaly.  SKIN:  No rashes.  No nodules.  EXTREMITIES:  Two plus pulses throughout, no cyanosis, no clubbing.  Of  note, the left arm is not swollen.  It has the same temperature and  normal pulses compared with the right.  There is good capillary refill.  NEUROLOGIC:  Oriented to person, place and time.  Cranial nerves II-XII  grossly intact.  Motor grossly intact throughout.   EKG (done in the ER) sinus rhythm, rate 62, short PR interval, otherwise  intervals within normal limits, axis within normal limits, no acute ST-T  wave changes.   ASSESSMENT/PLAN:  1. Arm discomfort.  The patient's arm discomfort does not appear to      have a vascular or cardiac etiology.  At this point, I do not  think      further cardiovascular testing is warranted.  Rather it may be a      neuromuscular discomfort.  I have spoken with physicians at [facility] who kindly agreed to see her today.  The patient      is agreeable with this.  2. Hypertension.  Blood pressure was elevated in the emergency room,      however, she is not taking the medicine and it has been fine.  I      asked her to take it 3 times a week.  She can keep a blood pressure      diary.  I will see her back to review this.   FOLLOWUP:  I will see her back for further evaluation of her  hypertension.     Rollene Rotunda, MD, [facility]  Electronically Signed    JH/MedQ  DD: 06/03/2007  DT: 06/03/2007  Job #: [id number]   cc:   Marcelino Duster L. Vincente Poli, M.D.  Doug Sou, M.D.

## 2011-01-10 NOTE — Op Note (Signed)
Jamie Mayer, Jamie Mayer              ACCOUNT NO.:  [id number]   MEDICAL RECORD NO.:  [id number]          PATIENT TYPE:  INP   LOCATION:  9399                          FACILITY:  [facility]   PHYSICIAN:  Michelle L. Grewal, M.D.DATE OF BIRTH:  01-23-1956   DATE OF PROCEDURE:  02/18/2005  DATE OF DISCHARGE:                                 OPERATIVE REPORT   PREOPERATIVE DIAGNOSIS:  Symptomatic fibroids.   POSTOPERATIVE DIAGNOSES:  1.  Symptomatic fibroids.  2.  Left paratubal cyst.   PROCEDURES:  1.  Abdominal myomectomies.  2.  Paratubal cystectomy.   SURGEON:  Michelle L. Vincente Poli, M.D.   ASSISTANT:  Dineen Kid. Rana Snare, M.D.   ANESTHESIA:  General.   SPECIMENS:  Eleven fibroids and one paratubal cyst.   ESTIMATED BLOOD LOSS:  Minimal.   COMPLICATIONS:  None.   PROCEDURE:  The patient was taken to the operating room, where she was  intubated without difficulty.  She was then prepped and draped in the usual  sterile fashion.  A Foley catheter was inserted into the bladder.  A low  transverse incision was made, carried down to the fascia.  The fascia was  then scored in the midline and extended laterally.  The rectus muscles were  divided in the midline, the peritoneum was entered bluntly and the  peritoneal incision was then stretched.  The uterus was then elevated.  It  was noted to be myomatous and slightly enlarged.  The ovaries were normal.  There was no evidence of endometriosis.  There was a small left paratubal  cyst that was removed with the Bovie.  Most of the fibroids were intramural  and most were emanating from the anterior wall of the uterus, so a linear  incision was made from the fundus down inferiorly and we proceeded to remove  almost all the fibroids through that incision using excellent hemostasis.  The fibroids ranged in size from approximately 3 cm down to 0.5 cm.  After  nine fibroids were removed through this incision, we then closed it in  layers.  Endometrium  was entered, so we then closed the endometrium using 2-  0 Vicryl and then two additional  layers using continuous running stitch  were used to close this incision.  Hemostasis was noted.  There were then  two additional small subserosal fibroids coming from the posterior wall of  the uterus, which were removed using the Bovie with excellent hemostasis.  Interceed was placed over the incision.  The uterus was gently returned to  the abdomen.  The peritoneum was closed using 0 Vicryl in a continuous  running stitch and the rectus muscles were reapproximated using the same 0  Vicryl.  The fascia was closed using 0 Vicryl using a continuous stitch,  starting at each corner and  meeting in the midline.  After irrigation and noting hemostasis, the skin  was closed with staples.  All sponge, lap and instrument counts were correct  x2.  The patient tolerated the procedure well and went to the recovery room  in stable condition.       MLG/MEDQ  D:  02/18/2005  T:  02/18/2005  Job:  [id number]

## 2011-01-10 NOTE — Discharge Summary (Signed)
NAMEDORANN, DAVIDSON              ACCOUNT NO.:  [id number]   MEDICAL RECORD NO.:  [id number]          PATIENT TYPE:  INP   LOCATION:  9310                          FACILITY:  [facility]   PHYSICIAN:  Michelle L. Grewal, M.D.DATE OF BIRTH:  04/18/1956   DATE OF ADMISSION:  02/18/2005  DATE OF DISCHARGE:  02/20/2005                                 DISCHARGE SUMMARY   ADMITTING DIAGNOSES:  Symptomatic fibroids.   DISCHARGE DIAGNOSES:  Symptomatic fibroids.   HOSPITAL COURSE:  The patient is a 55 year old female with symptomatic  fibroids, pain, and menometrorrhagia.  Was anemic and she is status post  Depo Lupron x1.  She is a Scientist, product/process development.  On the day of admission she  was noted to have a hemoglobin of 13.8.  She underwent abdominal  myomectomies and a paratubal cystectomy.  EBL was minimal.  Postoperative  day #1 hemoglobin was 11.4.  She was doing very well and actually went home  on postoperative day #2 in good condition.  She was ambulating, tolerating  regular diet, and had good pain control with oral medication.  She was  urinating without difficulty and had flatus.  She was discharged home with  ibuprofen 600 mg p.o. q.6h. as needed for pain, Tylox one to two every four  to six as needed for pain.  She will follow up on Monday for me to remove  her staples and she was given instructions for no driving, to call if she  has nausea, vomiting, temperature greater than 100.5, redness or drainage  from incision site.       MLG/MEDQ  D:  03/26/2005  T:  03/26/2005  Job:  [id number]

## 2011-06-05 LAB — POCT CARDIAC MARKERS
Operator id: 294521
Troponin i, poc: <0.05

## 2011-06-11 LAB — POCT HEMOGLOBIN-HEMACUE
Hemoglobin: 14.1
Operator id: 112821

## 2011-07-29 ENCOUNTER — Other Ambulatory Visit (HOSPITAL_COMMUNITY): Payer: Self-pay | Admitting: Obstetrics and Gynecology

## 2011-07-29 DIAGNOSIS — Z1231 Encounter for screening mammogram for malignant neoplasm of breast: Secondary | ICD-10-CM

## 2011-08-01 ENCOUNTER — Ambulatory Visit (INDEPENDENT_AMBULATORY_CARE_PROVIDER_SITE_OTHER): Payer: BC Managed Care – PPO | Admitting: *Deleted

## 2011-08-01 DIAGNOSIS — Z23 Encounter for immunization: Secondary | ICD-10-CM

## 2011-08-12 ENCOUNTER — Ambulatory Visit (INDEPENDENT_AMBULATORY_CARE_PROVIDER_SITE_OTHER): Payer: BC Managed Care – PPO | Admitting: Family Medicine

## 2011-08-12 ENCOUNTER — Encounter: Payer: Self-pay | Admitting: Family Medicine

## 2011-08-12 DIAGNOSIS — N644 Mastodynia: Secondary | ICD-10-CM

## 2011-08-12 DIAGNOSIS — IMO0002 Reserved for concepts with insufficient information to code with codable children: Secondary | ICD-10-CM

## 2011-08-12 MED ORDER — ATENOLOL-CHLORTHALIDONE 50-25 MG PO TABS: ORAL_TABLET | ORAL | Status: DC

## 2011-08-12 NOTE — Progress Notes (Signed)
  Subjective:    Patient ID: Jamie Mayer, female    DOB: 11-Aug-1956, 55 y.o.   MRN: [id number]  HPI Jamie Mayer is a 55 year old female, nonsmoker, who comes in today for evaluation of two problems.  She has underlying hypertension and is on a combination of a beta blocker and diuretic Ziac daily.  BP averages around 135/85.  However, she is complaining of swelling of her feet.  We discussed for his options.  We will change her blood pressure medicine to increase the diuretic.  She is having normal periods monthly and for the past 4 months.  This had severe soreness in both breasts.  Last mammogram January 2012 normal  This is been only going on for the last couple months.  Once her period starts to soreness goes away.  She checks with her GYN who came up with no diagnosis.  Advised over-the-counter vitamin E   Review of Systems    Cardiovascular GYN review of systems otherwise negative Objective:   Physical Exam  Well-developed well-nourished, female, in no acute distress.  BP right arm 138/88, pulse 70 and regular.  Bilateral breast exam shows the breast to be symmetrical.  No skin changes no nipple discharge or rash.  There is small pea sized movable rubbery lesion of right breast, right, below the nipple at 6 o'clock mammogram due in January      Assessment & Plan:  Hypertension adult.  Plan switch to a more potent diuretic to get rid of the swelling of her feet and did drop her BP down a little more 135/85 or less.  Breast tenderness.  Motrin 400 b.i.d.

## 2011-08-12 NOTE — Patient Instructions (Signed)
Stomp your current blood pressure medication and switch to Tenoretic 50 -- 25 one half tab q.a.m. Follow-up physical in March, sooner if any problems.  Motrin 400 mg twice daily with food

## 2011-08-29 ENCOUNTER — Ambulatory Visit (INDEPENDENT_AMBULATORY_CARE_PROVIDER_SITE_OTHER): Payer: BC Managed Care – PPO | Admitting: Internal Medicine

## 2011-08-29 ENCOUNTER — Encounter: Payer: Self-pay | Admitting: Internal Medicine

## 2011-08-29 VITALS — BP 138/98 | Temp 98.2°F | Wt 176.0 lb

## 2011-08-29 DIAGNOSIS — R42 Dizziness and giddiness: Secondary | ICD-10-CM

## 2011-08-29 DIAGNOSIS — I10 Essential (primary) hypertension: Secondary | ICD-10-CM

## 2011-08-29 DIAGNOSIS — IMO0002 Reserved for concepts with insufficient information to code with codable children: Secondary | ICD-10-CM

## 2011-08-29 LAB — POCT HEMOGLOBIN: Hemoglobin: 12.8

## 2011-08-29 NOTE — Progress Notes (Signed)
  Subjective:    Patient ID: Jamie Mayer, female    DOB: [birth year], 56 y.o.   MRN: [id number]  HPI 56 year old patient who has treated hypertension. The patient was seen last month and Ziac was discontinued and a new prescription for Tenoretic supplied. The change was due to peripheral edema and the need for a stronger diuretic. She has cut her salt consumption as recommended and her edema has resolved. She is perimenopausal and has had menstrual bleeding for the past 2 weeks. This morning she checked her blood pressure was low at 88/57. Complaints include a lightheadedness that has persisted through the afternoon;  blood pressure on arrival today 138/98     Review of Systems  Neurological: Positive for light-headedness.       Objective:   Physical Exam  Constitutional: She is oriented to person, place, and time. She appears well-developed and well-nourished.       Blood pressure 130/80  HENT:  Head: Normocephalic.  Right Ear: External ear normal.  Left Ear: External ear normal.  Mouth/Throat: Oropharynx is clear and moist.  Eyes: Conjunctivae and EOM are normal. Pupils are equal, round, and reactive to light.  Neck: Normal range of motion. Neck supple. No thyromegaly present.  Cardiovascular: Normal rate, regular rhythm, normal heart sounds and intact distal pulses.   Pulmonary/Chest: Effort normal and breath sounds normal.  Abdominal: Soft. Bowel sounds are normal. She exhibits no mass. There is no tenderness.  Musculoskeletal: Normal range of motion.  Lymphadenopathy:    She has no cervical adenopathy.  Neurological: She is alert and oriented to person, place, and time.  Skin: Skin is warm and dry. No rash noted.  Psychiatric: She has a normal mood and affect. Her behavior is normal.          Assessment & Plan:    Lightheadedness. Doubt that  this is related to low blood pressure. She presently remains on Ziac until her present supply is completed with a new  prescription for Tenoretic available. 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 Daily iron recommended;  she will follow up with gynecology for a periods persist  Hypertension stable. Followup as scheduled. She will call if the dizziness persists

## 2011-08-29 NOTE — Patient Instructions (Signed)
Limit your sodium (Salt) intake    It is important that you exercise regularly, at least 20 minutes 3 to 4 times per week.  If you develop chest pain or shortness of breath seek  medical attention.  Please check your blood pressure on a regular basis.  If it is consistently greater than 150/90, please make an office appointment.  

## 2011-09-08 ENCOUNTER — Ambulatory Visit (HOSPITAL_COMMUNITY)
Admission: RE | Admit: 2011-09-08 | Discharge: 2011-09-08 | Disposition: A | Discharge status: Home / Self Care | Payer: BC Managed Care – PPO | Source: Ambulatory Visit | Attending: Obstetrics and Gynecology | Admitting: Obstetrics and Gynecology

## 2011-09-08 DIAGNOSIS — Z1231 Encounter for screening mammogram for malignant neoplasm of breast: Secondary | ICD-10-CM | POA: Insufficient documentation

## 2011-09-22 ENCOUNTER — Telehealth: Payer: Self-pay | Admitting: *Deleted

## 2011-09-22 NOTE — Telephone Encounter (Signed)
Pt is having severe nasal congestion, and attempted to use Sudafed but it caused her BP to go up.  She cannot sleep at all.

## 2011-09-22 NOTE — Telephone Encounter (Signed)
hydromet 4 oz half tsp qhs 1rf

## 2011-09-23 MED ORDER — HYDROCODONE-HOMATROPINE 5-1.5 MG/5ML PO SYRP: 5.0000 mL | ORAL_SOLUTION | Freq: Three times a day (TID) | ORAL | Status: AC | PRN

## 2011-09-23 NOTE — Telephone Encounter (Signed)
Left message on machine for patient  And rx called in per Dr Tawanna Cooler

## 2011-10-17 ENCOUNTER — Ambulatory Visit (INDEPENDENT_AMBULATORY_CARE_PROVIDER_SITE_OTHER): Payer: BC Managed Care – PPO | Admitting: Internal Medicine

## 2011-10-17 ENCOUNTER — Encounter: Payer: Self-pay | Admitting: Internal Medicine

## 2011-10-17 DIAGNOSIS — R059 Cough, unspecified: Secondary | ICD-10-CM

## 2011-10-17 DIAGNOSIS — J069 Acute upper respiratory infection, unspecified: Secondary | ICD-10-CM

## 2011-10-17 DIAGNOSIS — M549 Dorsalgia, unspecified: Secondary | ICD-10-CM

## 2011-10-17 DIAGNOSIS — R05 Cough: Secondary | ICD-10-CM

## 2011-10-17 MED ORDER — BENZONATATE 200 MG PO CAPS: 200.0000 mg | ORAL_CAPSULE | Freq: Three times a day (TID) | ORAL | Status: AC | PRN

## 2011-10-17 MED ORDER — TRAMADOL HCL 50 MG PO TABS: 50.0000 mg | ORAL_TABLET | Freq: Three times a day (TID) | ORAL | Status: AC | PRN

## 2011-10-17 NOTE — Patient Instructions (Signed)
Get plenty of rest, Drink lots of  clear liquids, and use Tylenol or ibuprofen for fever and discomfort.    Most patients with low back pain will improve with time over the next two  weeks.  Keep active but avoid any activities that cause pain.  Apply moist heat to the low back area several times daily.  Call or return to clinic prn if these symptoms worsen or fail to improve as anticipated.

## 2011-10-17 NOTE — Progress Notes (Signed)
  Subjective:    Patient ID: Jamie Mayer, female    DOB: 03/30/1956, 56 y.o.   MRN: [id number]  HPI  56 year old patient who presents with a four-day history of cough this has resulted in a lumbar back pain aggravated by the cough. Cough is nonproductive. She does have a history of asthma and allergic rhinitis but has not had any wheezing. She has not tolerated hydrocodone-based cough medicines in the past. No fever or chills    Review of Systems  Constitutional: Negative.   HENT: Positive for congestion. Negative for hearing loss, sore throat, rhinorrhea, dental problem, sinus pressure and tinnitus.   Eyes: Negative for pain, discharge and visual disturbance.  Respiratory: Positive for cough. Negative for shortness of breath.   Cardiovascular: Negative for chest pain, palpitations and leg swelling.  Gastrointestinal: Negative for nausea, vomiting, abdominal pain, diarrhea, constipation, blood in stool and abdominal distention.  Genitourinary: Negative for dysuria, urgency, frequency, hematuria, flank pain, vaginal bleeding, vaginal discharge, difficulty urinating, vaginal pain and pelvic pain.  Musculoskeletal: Positive for back pain. Negative for joint swelling, arthralgias and gait problem.  Skin: Negative for rash.  Neurological: Negative for dizziness, syncope, speech difficulty, weakness, numbness and headaches.  Hematological: Negative for adenopathy.  Psychiatric/Behavioral: Negative for behavioral problems, dysphoric mood and agitation. The patient is not nervous/anxious.        Objective:   Physical Exam  Constitutional: She is oriented to person, place, and time. She appears well-developed and well-nourished.  HENT:  Head: Normocephalic.  Right Ear: External ear normal.  Left Ear: External ear normal.  Mouth/Throat: Oropharynx is clear and moist.  Eyes: Conjunctivae and EOM are normal. Pupils are equal, round, and reactive to light.  Neck: Normal range of motion. Neck  supple. No thyromegaly present.  Cardiovascular: Normal rate, regular rhythm, normal heart sounds and intact distal pulses.   Pulmonary/Chest: Effort normal and breath sounds normal. No respiratory distress. She has no wheezes. She has no rales.  Abdominal: Soft. Bowel sounds are normal. She exhibits no mass. There is no tenderness.  Musculoskeletal: Normal range of motion.  Lymphadenopathy:    She has no cervical adenopathy.  Neurological: She is alert and oriented to person, place, and time.  Skin: Skin is warm and dry. No rash noted.  Psychiatric: She has a normal mood and affect. Her behavior is normal.          Assessment & Plan:   Viral URI Back pain  We'll treat symptomatically with Tessalon as well as tramadol.

## 2011-11-27 ENCOUNTER — Encounter: Payer: Self-pay | Admitting: Family Medicine

## 2011-11-27 ENCOUNTER — Ambulatory Visit (INDEPENDENT_AMBULATORY_CARE_PROVIDER_SITE_OTHER): Payer: BC Managed Care – PPO | Admitting: Family Medicine

## 2011-11-27 VITALS — BP 140/98 | Temp 97.6°F | Wt 175.0 lb

## 2011-11-27 DIAGNOSIS — R0789 Other chest pain: Secondary | ICD-10-CM

## 2011-11-27 DIAGNOSIS — R071 Chest pain on breathing: Secondary | ICD-10-CM

## 2011-11-27 LAB — BASIC METABOLIC PANEL
Calcium: 9.3 mg/dL (ref 8.4–10.5)
GFR: 97.68 mL/min (ref >60.00)
Glucose, Bld: 64 mg/dL — ABNORMAL LOW (ref 70–99)
Potassium: 3.7 mEq/L (ref 3.5–5.1)
Sodium: 140 mEq/L (ref 135–145)

## 2011-11-27 NOTE — Progress Notes (Signed)
  Subjective:    Patient ID: JOSHALYN ANCHETA, female    DOB: 03/12/1956, 56 y.o.   MRN: [id number]  HPI  Shonteria is a 56 year old female who comes in today with a four-hour history of soreness in the right side of her chest!!!!!!!!!!!!!  She describes as a dull ache and it hurts when she twists or takes a deep breath no fever chills no history of trauma  Review of Systems General and cardio and pulmonary review of systems otherwise negative    Objective:   Physical Exam Well-developed well-nourished female in no acute distress cardiopulmonary exam normal there is some tenderness in the right posterior subscapular muscles       Assessment & Plan:  Chest wall pain reassured Motrin 600 mg twice daily

## 2011-11-27 NOTE — Patient Instructions (Signed)
Motrin 600 mg twice daily with food for the chest wall pain

## 2011-12-08 ENCOUNTER — Other Ambulatory Visit: Payer: Self-pay | Admitting: *Deleted

## 2011-12-08 DIAGNOSIS — E039 Hypothyroidism, unspecified: Secondary | ICD-10-CM

## 2011-12-08 MED ORDER — LEVOTHYROXINE SODIUM 88 MCG PO TABS: 88.0000 ug | ORAL_TABLET | Freq: Every day | ORAL | Status: DC

## 2011-12-17 ENCOUNTER — Telehealth: Payer: Self-pay | Admitting: Family Medicine

## 2011-12-17 NOTE — Telephone Encounter (Signed)
Patient called stating that she need a refill of her atenolol/chlorthal 50-25 mg tabs(tenoretic) called into [facility] at [address]). Please assist.

## 2011-12-18 MED ORDER — ATENOLOL-CHLORTHALIDONE 50-25 MG PO TABS: ORAL_TABLET | ORAL | Status: DC

## 2012-01-01 ENCOUNTER — Telehealth: Payer: Self-pay | Admitting: Family Medicine

## 2012-01-01 ENCOUNTER — Other Ambulatory Visit (INDEPENDENT_AMBULATORY_CARE_PROVIDER_SITE_OTHER): Payer: BC Managed Care – PPO

## 2012-01-01 DIAGNOSIS — Z0000 Encounter for general adult medical examination without abnormal findings: Secondary | ICD-10-CM

## 2012-01-01 LAB — CBC WITH DIFFERENTIAL/PLATELET
Basophils Relative: 0.3 % (ref 0.0–3.0)
Eosinophils Absolute: 0.1 10*3/uL (ref 0.0–0.7)
Eosinophils Relative: 1.1 % (ref 0.0–5.0)
Hemoglobin: 13 g/dL (ref 12.0–15.0)
MCHC: 33 g/dL (ref 30.0–36.0)
MCV: 86.2 fl (ref 78.0–100.0)
Monocytes Absolute: 0.6 10*3/uL (ref 0.1–1.0)
Neutro Abs: 2.8 10*3/uL (ref 1.4–7.7)
Neutrophils Relative %: 54.1 % (ref 43.0–77.0)
RBC: 4.59 Mil/uL (ref 3.87–5.11)
WBC: 5.3 10*3/uL (ref 4.5–10.5)

## 2012-01-01 LAB — HEPATIC FUNCTION PANEL
ALT: 16 U/L (ref 0–35)
Albumin: 3.4 g/dL — ABNORMAL LOW (ref 3.5–5.2)
Alkaline Phosphatase: 68 U/L (ref 39–117)
Bilirubin, Direct: 0 mg/dL (ref 0.0–0.3)
Total Protein: 7 g/dL (ref 6.0–8.3)

## 2012-01-01 LAB — BASIC METABOLIC PANEL
CO2: 27 mEq/L (ref 19–32)
Chloride: 103 mEq/L (ref 96–112)
Creatinine, Ser: 0.7 mg/dL (ref 0.4–1.2)
Potassium: 3.8 mEq/L (ref 3.5–5.1)
Sodium: 139 mEq/L (ref 135–145)

## 2012-01-01 LAB — LIPID PANEL
Cholesterol: 178 mg/dL (ref 0–200)
LDL Cholesterol: 86 mg/dL (ref 0–99)
Total CHOL/HDL Ratio: 2

## 2012-01-01 LAB — POCT URINALYSIS DIPSTICK
Bilirubin, UA: NEGATIVE
Glucose, UA: NEGATIVE
Leukocytes, UA: NEGATIVE
Nitrite, UA: NEGATIVE
Urobilinogen, UA: 0.2

## 2012-01-01 NOTE — Telephone Encounter (Signed)
Rachel please call.......... this is a viral infection....... over-the-counter clear eyes,,,,,,,, 2 drops 3 times daily if symptoms persist consult Dr. Kendrick Ranch ophthalmologist

## 2012-01-01 NOTE — Telephone Encounter (Signed)
Left message on machine for patient

## 2012-01-01 NOTE — Telephone Encounter (Signed)
Pt has cpx labs schd on 01/06/12 and cpx on 01/13/12. Pt says that she has pink eye and is req an eye drop to be called in to [facility] at [facility] on [address], since she is already schd for [employer].

## 2012-01-02 ENCOUNTER — Other Ambulatory Visit: Payer: BC Managed Care – PPO

## 2012-01-06 ENCOUNTER — Other Ambulatory Visit: Payer: BC Managed Care – PPO

## 2012-01-07 ENCOUNTER — Telehealth: Payer: Self-pay | Admitting: Family Medicine

## 2012-01-07 MED ORDER — PREDNISONE (PAK) 10 MG PO TABS: 10.0000 mg | ORAL_TABLET | Freq: Every day | ORAL | Status: AC

## 2012-01-07 NOTE — Telephone Encounter (Signed)
Patient states that upon calling her back dial [phone] and ask to have Mrs. 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

## 2012-01-07 NOTE — Telephone Encounter (Signed)
Rx sent to pharmacy   

## 2012-01-07 NOTE — Telephone Encounter (Signed)
Patient called stating that she has poison ivy and it is spreading and would like to have something called into her pharmacy [facility] at [facility] on [address]. Please advise.

## 2012-01-07 NOTE — Telephone Encounter (Signed)
Prednisone dose pack 10 mg 12 day 

## 2012-01-13 ENCOUNTER — Encounter: Payer: Self-pay | Admitting: Family Medicine

## 2012-01-13 ENCOUNTER — Ambulatory Visit (INDEPENDENT_AMBULATORY_CARE_PROVIDER_SITE_OTHER): Payer: BC Managed Care – PPO | Admitting: Family Medicine

## 2012-01-13 VITALS — BP 110/80 | Temp 98.1°F | Ht 63.0 in | Wt 179.0 lb

## 2012-01-13 DIAGNOSIS — I10 Essential (primary) hypertension: Secondary | ICD-10-CM

## 2012-01-13 DIAGNOSIS — IMO0002 Reserved for concepts with insufficient information to code with codable children: Secondary | ICD-10-CM

## 2012-01-13 DIAGNOSIS — L509 Urticaria, unspecified: Secondary | ICD-10-CM

## 2012-01-13 DIAGNOSIS — Z0000 Encounter for general adult medical examination without abnormal findings: Secondary | ICD-10-CM

## 2012-01-13 DIAGNOSIS — J309 Allergic rhinitis, unspecified: Secondary | ICD-10-CM

## 2012-01-13 DIAGNOSIS — N938 Other specified abnormal uterine and vaginal bleeding: Secondary | ICD-10-CM

## 2012-01-13 DIAGNOSIS — E039 Hypothyroidism, unspecified: Secondary | ICD-10-CM

## 2012-01-13 DIAGNOSIS — N949 Unspecified condition associated with female genital organs and menstrual cycle: Secondary | ICD-10-CM

## 2012-01-13 DIAGNOSIS — N644 Mastodynia: Secondary | ICD-10-CM

## 2012-01-13 DIAGNOSIS — N926 Irregular menstruation, unspecified: Secondary | ICD-10-CM

## 2012-01-13 MED ORDER — ATENOLOL-CHLORTHALIDONE 50-25 MG PO TABS: ORAL_TABLET | ORAL | Status: DC

## 2012-01-13 MED ORDER — LEVOTHYROXINE SODIUM 88 MCG PO TABS: 88.0000 ug | ORAL_TABLET | Freq: Every day | ORAL | Status: DC

## 2012-01-13 NOTE — Patient Instructions (Signed)
Called Dr. Para Skeans dermatologists to inquire about treatment of the keloids  Continue your current medication  Remember to do a thorough breast exam monthly on your birthday  Return in one year sooner if any problems

## 2012-01-13 NOTE — Progress Notes (Signed)
  Subjective:    Patient ID: Jamie Mayer, female    DOB: Apr 03, 1956, 56 y.o.   MRN: [id number]  HPI Jamie Mayer is a 56 year old married female nonsmoker who comes in today for general physical examination  Her last menstrual period was in February hopefully that was her last one. Should her recent mammogram which was normal  She takes Tenoretic 5025 for hypertension BP 110/80  She takes Synthroid 80 mcg daily for hypothyroidism.......... TSH at goal  She's recently been on 10 mg of prednisone a day for contact dermatitis is resolving   Review of Systems  Constitutional: Negative.   HENT: Negative.   Eyes: Negative.   Respiratory: Negative.   Cardiovascular: Negative.   Gastrointestinal: Negative.   Genitourinary: Negative.   Musculoskeletal: Negative.   Neurological: Negative.   Hematological: Negative.   Psychiatric/Behavioral: Negative.        Objective:   Physical Exam  Constitutional: She appears well-developed and well-nourished.  HENT:  Head: Normocephalic and atraumatic.  Right Ear: External ear normal.  Left Ear: External ear normal.  Nose: Nose normal.  Mouth/Throat: Oropharynx is clear and moist.  Eyes: EOM are normal. Pupils are equal, round, and reactive to light.  Neck: Normal range of motion. Neck supple. No thyromegaly present.  Cardiovascular: Normal rate, regular rhythm, normal heart sounds and intact distal pulses.  Exam reveals no gallop and no friction rub.   No murmur heard. Pulmonary/Chest: Effort normal and breath sounds normal.  Abdominal: Soft. Bowel sounds are normal. She exhibits no distension and no mass. There is no tenderness. There is no rebound.  Genitourinary: Vagina normal and uterus normal. Guaiac negative stool. No vaginal discharge found.  Musculoskeletal: Normal range of motion.  Lymphadenopathy:    She has no cervical adenopathy.  Neurological: She is alert. She has normal reflexes. No cranial nerve deficit. She exhibits normal  muscle tone. Coordination normal.  Skin: Skin is warm and dry.       Total body skin exam normal except for keloid mid upper left chest area and in the C-section scar  Psychiatric: She has a normal mood and affect. Her behavior is normal. Judgment and thought content normal.          Assessment & Plan:  Healthy female  Hypertension continue Tenoretic 1 daily  Hypothyroidism continue Synthroid 88 mcg daily  Keloid refer to dermatology  Contact dermatitis Taper prednisone  Perimenopausal observe

## 2012-02-20 ENCOUNTER — Other Ambulatory Visit: Payer: Self-pay | Admitting: Obstetrics and Gynecology

## 2012-03-30 ENCOUNTER — Telehealth: Payer: Self-pay | Admitting: Family Medicine

## 2012-03-30 NOTE — Telephone Encounter (Signed)
Pt states she is having pressure behind left breast states it is a burning feeling but hard to explain "just knows it is something different" experiencing x 1 week all day and night. Please contact

## 2012-03-30 NOTE — Telephone Encounter (Signed)
Fleet Contras please call have her come in next week for evaluation

## 2012-03-30 NOTE — Telephone Encounter (Signed)
Patient is aware and appointment made. 

## 2012-04-01 ENCOUNTER — Other Ambulatory Visit: Payer: Self-pay | Admitting: Family Medicine

## 2012-04-01 ENCOUNTER — Ambulatory Visit
Admission: RE | Admit: 2012-04-01 | Discharge: 2012-04-01 | Disposition: A | Discharge status: Home / Self Care | Payer: BC Managed Care – PPO | Source: Ambulatory Visit | Attending: Family Medicine | Admitting: Family Medicine

## 2012-04-01 DIAGNOSIS — R079 Chest pain, unspecified: Secondary | ICD-10-CM

## 2012-04-05 ENCOUNTER — Ambulatory Visit: Payer: BC Managed Care – PPO | Admitting: Family Medicine

## 2012-06-04 ENCOUNTER — Other Ambulatory Visit: Payer: Self-pay | Admitting: Obstetrics and Gynecology

## 2012-08-04 ENCOUNTER — Other Ambulatory Visit (HOSPITAL_COMMUNITY): Payer: Self-pay | Admitting: Obstetrics and Gynecology

## 2012-08-04 DIAGNOSIS — Z1231 Encounter for screening mammogram for malignant neoplasm of breast: Secondary | ICD-10-CM

## 2012-09-08 ENCOUNTER — Ambulatory Visit (HOSPITAL_COMMUNITY)
Admission: RE | Admit: 2012-09-08 | Discharge: 2012-09-08 | Disposition: A | Discharge status: Home / Self Care | Payer: BC Managed Care – PPO | Source: Ambulatory Visit | Attending: Obstetrics and Gynecology | Admitting: Obstetrics and Gynecology

## 2012-09-08 DIAGNOSIS — Z1231 Encounter for screening mammogram for malignant neoplasm of breast: Secondary | ICD-10-CM

## 2013-01-06 ENCOUNTER — Other Ambulatory Visit: Payer: BC Managed Care – PPO

## 2013-01-13 ENCOUNTER — Encounter: Payer: BC Managed Care – PPO | Admitting: Family Medicine

## 2013-02-15 ENCOUNTER — Other Ambulatory Visit (INDEPENDENT_AMBULATORY_CARE_PROVIDER_SITE_OTHER): Payer: BC Managed Care – PPO

## 2013-02-15 DIAGNOSIS — Z0000 Encounter for general adult medical examination without abnormal findings: Secondary | ICD-10-CM

## 2013-02-15 LAB — POCT URINALYSIS DIPSTICK
Bilirubin, UA: NEGATIVE
Blood, UA: NEGATIVE
Glucose, UA: NEGATIVE
Ketones, UA: NEGATIVE
Spec Grav, UA: 1.01
Urobilinogen, UA: 0.2

## 2013-02-15 LAB — BASIC METABOLIC PANEL
Chloride: 101 mEq/L (ref 96–112)
GFR: 90.58 mL/min (ref >60.00)
Potassium: 4 mEq/L (ref 3.5–5.1)
Sodium: 138 mEq/L (ref 135–145)

## 2013-02-15 LAB — HEPATIC FUNCTION PANEL
ALT: 19 U/L (ref 0–35)
AST: 20 U/L (ref 0–37)
Total Bilirubin: 0.8 mg/dL (ref 0.3–1.2)
Total Protein: 7.2 g/dL (ref 6.0–8.3)

## 2013-02-15 LAB — CBC WITH DIFFERENTIAL/PLATELET
Basophils Relative: 0.5 % (ref 0.0–3.0)
Eosinophils Relative: 1.7 % (ref 0.0–5.0)
HCT: 41.9 % (ref 36.0–46.0)
Hemoglobin: 14 g/dL (ref 12.0–15.0)
Lymphs Abs: 1.9 10*3/uL (ref 0.7–4.0)
Monocytes Relative: 9.7 % (ref 3.0–12.0)
Neutro Abs: 3.3 10*3/uL (ref 1.4–7.7)
Platelets: 278 10*3/uL (ref 150.0–400.0)
RBC: 4.86 Mil/uL (ref 3.87–5.11)
WBC: 5.9 10*3/uL (ref 4.5–10.5)

## 2013-02-15 LAB — LIPID PANEL
Cholesterol: 187 mg/dL (ref 0–200)
HDL: 64.8 mg/dL (ref >39.00)
LDL Cholesterol: 104 mg/dL — ABNORMAL HIGH (ref 0–99)
Triglycerides: 92 mg/dL (ref 0.0–149.0)
VLDL: 18.4 mg/dL (ref 0.0–40.0)

## 2013-02-15 LAB — TSH: TSH: 3.18 u[IU]/mL (ref 0.35–5.50)

## 2013-02-23 ENCOUNTER — Other Ambulatory Visit: Payer: Self-pay | Admitting: Obstetrics and Gynecology

## 2013-02-24 ENCOUNTER — Encounter: Payer: BC Managed Care – PPO | Admitting: Family Medicine

## 2013-03-01 ENCOUNTER — Ambulatory Visit (INDEPENDENT_AMBULATORY_CARE_PROVIDER_SITE_OTHER): Payer: BC Managed Care – PPO | Admitting: Family Medicine

## 2013-03-01 ENCOUNTER — Encounter: Payer: Self-pay | Admitting: Family Medicine

## 2013-03-01 VITALS — BP 110/88 | Temp 97.8°F | Ht 63.0 in | Wt 175.0 lb

## 2013-03-01 DIAGNOSIS — IMO0002 Reserved for concepts with insufficient information to code with codable children: Secondary | ICD-10-CM

## 2013-03-01 DIAGNOSIS — R071 Chest pain on breathing: Secondary | ICD-10-CM

## 2013-03-01 DIAGNOSIS — R0789 Other chest pain: Secondary | ICD-10-CM

## 2013-03-01 DIAGNOSIS — E039 Hypothyroidism, unspecified: Secondary | ICD-10-CM

## 2013-03-01 MED ORDER — ATENOLOL-CHLORTHALIDONE 50-25 MG PO TABS: ORAL_TABLET | ORAL | Status: DC

## 2013-03-01 MED ORDER — LEVOTHYROXINE SODIUM 88 MCG PO TABS: 88.0000 ug | ORAL_TABLET | Freq: Every day | ORAL | Status: DC

## 2013-03-01 NOTE — Patient Instructions (Signed)
Continue Synthroid and Tenoretic  Begin a walking program 20 minutes daily  Avoid caffeine peppermint and take Prilosec OTC 1 before breakfast and 1 before your evening female  Nothing to drink for 3 hours prior to bedtime  Elevates her head and sleep on 2 pillows  For the plantar fasciitis the treatment is Motrin 400 mg twice daily with food stretching in the morning and pack with ice for 30 minutes at bedtime  Return in one month for followup  A lot of your symptoms or GYN related to her estrogen deficiency call Dr. Milton Ferguson to consult with her about your options

## 2013-03-01 NOTE — Progress Notes (Signed)
  Subjective:    Patient ID: Jamie Mayer, female    DOB: 25-Nov-1955, 56 y.o.   MRN: [id number]  HPI Jamie Mayer is a 57 year old female married nonsmoker who comes in today for general physical examination because of a history of hypertension, hypothyroidism, and 6 new problems  She states when she walks the ear feels heavy she feels short of breath even when sitting. She has no chest pain. No exertional symptoms. No cardiac or pulmonary symptoms.  She states at night she wakes up with this feeling in his taste of acid in the back of her mouth and has a lot of coughing this has been normal on for about 2 months. She does not smoke or drink moderate amount caffeine and peppermint  She has occasional pain in her left arm as sharp as she was told one time should might have a pinched nerve. That comes and goes and is positional. She also as a pressure sensation left side of her chest. It lasts for a few minutes it occurs once or twice 3 times per week. She's concerned about her heart.  She also has pain in her left heel and pain in her right great toe  LMP was January 2014 her GYN is [name]. She's having a lot of trouble with hot flashes insomnia etc. Advised her to call [name]   Review of Systems  Constitutional: Negative.   HENT: Negative.   Eyes: Negative.   Respiratory: Negative.   Cardiovascular: Negative.   Gastrointestinal: Negative.   Genitourinary: Negative.   Musculoskeletal: Negative.   Neurological: Negative.   Psychiatric/Behavioral: Negative.        Objective:   Physical Exam  Nursing note and vitals reviewed. Constitutional: She appears well-developed and well-nourished.  HENT:  Head: Normocephalic and atraumatic.  Right Ear: External ear normal.  Left Ear: External ear normal.  Nose: Nose normal.  Mouth/Throat: Oropharynx is clear and moist.  Eyes: EOM are normal. Pupils are equal, round, and reactive to light.  Neck: Normal range of motion. Neck  supple. No thyromegaly present.  Cardiovascular: Normal rate, regular rhythm, normal heart sounds and intact distal pulses.  Exam reveals no gallop and no friction rub.   No murmur heard. Carotids and aorta normal peripheral pulses 2+ and symmetrical  Pulmonary/Chest: Effort normal and breath sounds normal.  Abdominal: Soft. Bowel sounds are normal. She exhibits no distension and no mass. There is no tenderness. There is no rebound.  Genitourinary:  Bilateral breast exam normal  Musculoskeletal: Normal range of motion.  Lymphadenopathy:    She has no cervical adenopathy.  Neurological: She is alert. She has normal reflexes. No cranial nerve deficit. She exhibits normal muscle tone. Coordination normal.  Skin: Skin is warm and dry.  Total body skin exam normal  Psychiatric: She has a normal mood and affect. Her behavior is normal. Judgment and thought content normal.          Assessment & Plan:  Healthy female  Perimenopausal with hot flashes mood disturbance etc. Consult with GYN  Reflux esophagitis outlined program  Chest wall pain reassured  Fasciitis left heel outlined program  Bunion right toe outlined treatment program  Hypertension continue Tenoretic one half tab daily  Hypo-thyroidism continue Synthroid 88 mcg daily followup as outlined

## 2013-03-29 ENCOUNTER — Ambulatory Visit: Payer: BC Managed Care – PPO | Admitting: Family Medicine

## 2013-08-09 ENCOUNTER — Other Ambulatory Visit (HOSPITAL_COMMUNITY): Payer: Self-pay | Admitting: Obstetrics and Gynecology

## 2013-08-09 DIAGNOSIS — Z1231 Encounter for screening mammogram for malignant neoplasm of breast: Secondary | ICD-10-CM

## 2013-09-09 ENCOUNTER — Ambulatory Visit (HOSPITAL_COMMUNITY)
Admission: RE | Admit: 2013-09-09 | Discharge: 2013-09-09 | Disposition: A | Discharge status: Home / Self Care | Payer: BC Managed Care – PPO | Source: Ambulatory Visit | Attending: Obstetrics and Gynecology | Admitting: Obstetrics and Gynecology

## 2013-09-09 DIAGNOSIS — Z1231 Encounter for screening mammogram for malignant neoplasm of breast: Secondary | ICD-10-CM

## 2013-09-13 ENCOUNTER — Telehealth: Payer: Self-pay | Admitting: Family Medicine

## 2013-09-13 NOTE — Telephone Encounter (Signed)
Spoke with patient and an appointment made 

## 2013-09-13 NOTE — Telephone Encounter (Signed)
Pt would like test to ensure she has no clogged arteries, they include carotid artery, heart rythem, abnormal aortic aneurism and peripheral arterial test Pt states she was given info about these test and want to know if we do or does she need referral? Ok to leave message. pls leave message or lunch break at 12 or after 3:30 pm

## 2013-10-06 ENCOUNTER — Ambulatory Visit (INDEPENDENT_AMBULATORY_CARE_PROVIDER_SITE_OTHER): Payer: BC Managed Care – PPO | Admitting: Family Medicine

## 2013-10-06 ENCOUNTER — Encounter: Payer: Self-pay | Admitting: Family Medicine

## 2013-10-06 VITALS — BP 120/90 | Temp 97.8°F | Wt 185.0 lb

## 2013-10-06 DIAGNOSIS — R609 Edema, unspecified: Secondary | ICD-10-CM

## 2013-10-06 MED ORDER — HYDROCHLOROTHIAZIDE 12.5 MG PO TABS: 12.5000 mg | ORAL_TABLET | Freq: Every day | ORAL | Status: DC

## 2013-10-06 NOTE — Progress Notes (Signed)
   Subjective:    Patient ID: Jamie OxfordBarbara E Mayer, female    DOB: [birth year], 58 y.o.   MRN: [id number]  HPI Jamie MccreedyBarbara is a 58 year old female nonsmoker who comes in today for regulation of her arteries  She's concerned about blockage of her arteries although she has no symptoms. She's slightly overweight at 185 pounds she has a history of hypertension for which she takes Tenoretic 50-25 dose one half tab daily BP 120/90. Her blood sugar lipids all been normal. She does have some venous insufficiency and swelling of her lower legs.   Review of Systems Review of systems otherwise negative she does not exercise on a regular basis    Objective:   Physical Exam  Well-developed well-nourished female no acute distress vital signs stable she is afebrile examination of the carotids heart aorta and femoral popliteals dorsalis pedis a central normal. No bruits. 1+ pretibial edema      Assessment & Plan:  Normal arterial blood flow  Venous insufficiency,,,,,,,, salt free diet,,,,,, exercise daily,,,,,,,, hydrochlorothiazide 12.5 mg daily when necessary

## 2013-10-06 NOTE — Patient Instructions (Signed)
Complete salt free diet  Walk 30 minutes daily  Hydrochlorothiazide 12.5 mg....... one tablet daily in the morning when necessary for swelling of your legs

## 2014-02-27 ENCOUNTER — Other Ambulatory Visit (INDEPENDENT_AMBULATORY_CARE_PROVIDER_SITE_OTHER): Payer: BC Managed Care – PPO

## 2014-02-27 DIAGNOSIS — Z0000 Encounter for general adult medical examination without abnormal findings: Secondary | ICD-10-CM

## 2014-02-27 LAB — HEPATIC FUNCTION PANEL
ALK PHOS: 81 U/L (ref 39–117)
ALT: 14 U/L (ref 0–35)
AST: 20 U/L (ref 0–37)
Albumin: 3.4 g/dL — ABNORMAL LOW (ref 3.5–5.2)
BILIRUBIN TOTAL: 0.5 mg/dL (ref 0.2–1.2)
Bilirubin, Direct: 0 mg/dL (ref 0.0–0.3)
Total Protein: 6.8 g/dL (ref 6.0–8.3)

## 2014-02-27 LAB — CBC WITH DIFFERENTIAL/PLATELET
BASOS ABS: 0 10*3/uL (ref 0.0–0.1)
Basophils Relative: 0.6 % (ref 0.0–3.0)
EOS ABS: 0.1 10*3/uL (ref 0.0–0.7)
Eosinophils Relative: 1.9 % (ref 0.0–5.0)
HCT: 39.6 % (ref 36.0–46.0)
Hemoglobin: 13.2 g/dL (ref 12.0–15.0)
LYMPHS PCT: 34.2 % (ref 12.0–46.0)
Lymphs Abs: 1.8 10*3/uL (ref 0.7–4.0)
MCHC: 33.3 g/dL (ref 30.0–36.0)
MCV: 85.9 fl (ref 78.0–100.0)
MONOS PCT: 8.2 % (ref 3.0–12.0)
Monocytes Absolute: 0.4 10*3/uL (ref 0.1–1.0)
NEUTROS ABS: 2.9 10*3/uL (ref 1.4–7.7)
NEUTROS PCT: 55.1 % (ref 43.0–77.0)
PLATELETS: 271 10*3/uL (ref 150.0–400.0)
RBC: 4.61 Mil/uL (ref 3.87–5.11)
RDW: 16.7 % — AB (ref 11.5–15.5)
WBC: 5.2 10*3/uL (ref 4.0–10.5)

## 2014-02-27 LAB — POCT URINALYSIS DIPSTICK
BILIRUBIN UA: NEGATIVE
Blood, UA: NEGATIVE
GLUCOSE UA: NEGATIVE
Ketones, UA: NEGATIVE
Leukocytes, UA: NEGATIVE
Nitrite, UA: NEGATIVE
Protein, UA: NEGATIVE
SPEC GRAV UA: 1.01
UROBILINOGEN UA: 0.2
pH, UA: 7.5

## 2014-02-27 LAB — BASIC METABOLIC PANEL
BUN: 16 mg/dL (ref 6–23)
CO2: 28 meq/L (ref 19–32)
Calcium: 9.2 mg/dL (ref 8.4–10.5)
Chloride: 101 mEq/L (ref 96–112)
Creatinine, Ser: 0.8 mg/dL (ref 0.4–1.2)
GFR: 92.79 mL/min (ref >60.00)
GLUCOSE: 108 mg/dL — AB (ref 70–99)
POTASSIUM: 3.6 meq/L (ref 3.5–5.1)
SODIUM: 138 meq/L (ref 135–145)

## 2014-02-27 LAB — LIPID PANEL
CHOLESTEROL: 189 mg/dL (ref 0–200)
HDL: 61.7 mg/dL (ref >39.00)
LDL CALC: 105 mg/dL — AB (ref 0–99)
NonHDL: 127.3
TRIGLYCERIDES: 112 mg/dL (ref 0.0–149.0)
Total CHOL/HDL Ratio: 3
VLDL: 22.4 mg/dL (ref 0.0–40.0)

## 2014-02-27 LAB — TSH: TSH: 0.56 u[IU]/mL (ref 0.35–4.50)

## 2014-03-07 ENCOUNTER — Other Ambulatory Visit: Payer: Self-pay | Admitting: Family Medicine

## 2014-03-08 ENCOUNTER — Ambulatory Visit (INDEPENDENT_AMBULATORY_CARE_PROVIDER_SITE_OTHER): Payer: BC Managed Care – PPO | Admitting: Family Medicine

## 2014-03-08 ENCOUNTER — Ambulatory Visit (INDEPENDENT_AMBULATORY_CARE_PROVIDER_SITE_OTHER)
Admission: RE | Admit: 2014-03-08 | Discharge: 2014-03-08 | Disposition: A | Discharge status: Home / Self Care | Payer: BC Managed Care – PPO | Source: Ambulatory Visit | Attending: Family Medicine | Admitting: Family Medicine

## 2014-03-08 ENCOUNTER — Encounter: Payer: Self-pay | Admitting: Family Medicine

## 2014-03-08 VITALS — BP 108/78 | HR 60 | Temp 98.5°F | Ht 62.75 in | Wt 175.0 lb

## 2014-03-08 DIAGNOSIS — R05 Cough: Secondary | ICD-10-CM

## 2014-03-08 DIAGNOSIS — R059 Cough, unspecified: Secondary | ICD-10-CM

## 2014-03-08 DIAGNOSIS — Z0000 Encounter for general adult medical examination without abnormal findings: Secondary | ICD-10-CM

## 2014-03-08 DIAGNOSIS — E039 Hypothyroidism, unspecified: Secondary | ICD-10-CM

## 2014-03-08 DIAGNOSIS — IMO0002 Reserved for concepts with insufficient information to code with codable children: Secondary | ICD-10-CM

## 2014-03-08 DIAGNOSIS — R609 Edema, unspecified: Secondary | ICD-10-CM

## 2014-03-08 MED ORDER — LEVOTHYROXINE SODIUM 88 MCG PO TABS: ORAL_TABLET | ORAL | Status: DC

## 2014-03-08 MED ORDER — HYDROCHLOROTHIAZIDE 12.5 MG PO TABS: 12.5000 mg | ORAL_TABLET | Freq: Every day | ORAL | Status: DC

## 2014-03-08 MED ORDER — ATENOLOL-CHLORTHALIDONE 50-25 MG PO TABS: ORAL_TABLET | ORAL | Status: DC

## 2014-03-08 NOTE — Progress Notes (Signed)
Pre visit review using our clinic review tool, if applicable. No additional management support is needed unless otherwise documented below in the visit note. 

## 2014-03-08 NOTE — Progress Notes (Signed)
   Subjective:    Patient ID: Jamie OxfordBarbara E Mayer, female    DOB: 10-28-1955, 58 y.o.   MRN: [id number]  HPI Jamie MccreedyBarbara is a 58 year old married female nonsmoker who comes in today for general physical examination he has a history of hypertension, peripheral edema, hypothyroidism  Her blood pressure on Tenoretic 50-25 dose one half tab daily is 108/78  She takes Synthroid 88 mcg daily because of a history of hypothyroidism TSH level normal 0.56  She also takes hydrochlorothiazide 12.5 mg daily because of peripheral edema  She says she feels well overall but she's had some chest congestion for the past 3 months. She says she walks outside ear feels heavy. She's had no fever chills sputum production shortness of breath etc. etc. etc. She has had a history of allergic rhinitis and asthma but doesn't think she is wheezing.  She's her GYN doctor [place] yearly. LMP February 2015  Tetanus booster 2009   Review of Systems  Constitutional: Negative.   HENT: Negative.   Eyes: Negative.   Respiratory: Negative.   Cardiovascular: Negative.   Gastrointestinal: Negative.   Genitourinary: Negative.   Musculoskeletal: Negative.   Neurological: Negative.   Psychiatric/Behavioral: Negative.        Objective:   Physical Exam  Nursing note and vitals reviewed. Constitutional: She appears well-developed and well-nourished.  HENT:  Head: Normocephalic and atraumatic.  Right Ear: External ear normal.  Left Ear: External ear normal.  Nose: Nose normal.  Mouth/Throat: Oropharynx is clear and moist.  Eyes: EOM are normal. Pupils are equal, round, and reactive to light.  Neck: Normal range of motion. Neck supple. No thyromegaly present.  Cardiovascular: Normal rate, regular rhythm, normal heart sounds and intact distal pulses.  Exam reveals no gallop and no friction rub.   No murmur heard. Pulmonary/Chest: Effort normal and breath sounds normal.  Abdominal: Soft. Bowel sounds are normal. She  exhibits no distension and no mass. There is no tenderness. There is no rebound.  Musculoskeletal: Normal range of motion.  Lymphadenopathy:    She has no cervical adenopathy.  Neurological: She is alert. She has normal reflexes. No cranial nerve deficit. She exhibits normal muscle tone. Coordination normal.  Skin: Skin is warm and dry.  Total body skin exam normal, keloid mid chest  Psychiatric: She has a normal mood and affect. Her behavior is normal. Judgment and thought content normal.          Assessment & Plan:  Healthy female  Hypertension at goal continue current therapy  Hypothyroidism continue Synthroid 88 mcg  Peripheral edema continue the hypothyroidism and 12.5 mg daily  Cough,,,,,,,, check chest x-ray

## 2014-03-08 NOTE — Patient Instructions (Signed)
Go to the main office now for your chest x-ray  Continue your current medications  Followup in 1 year sooner if any problems

## 2014-03-23 ENCOUNTER — Other Ambulatory Visit: Payer: Self-pay | Admitting: Obstetrics and Gynecology

## 2014-03-24 LAB — CYTOLOGY - PAP

## 2014-08-07 ENCOUNTER — Other Ambulatory Visit (HOSPITAL_COMMUNITY): Payer: Self-pay | Admitting: Obstetrics and Gynecology

## 2014-08-07 DIAGNOSIS — Z1231 Encounter for screening mammogram for malignant neoplasm of breast: Secondary | ICD-10-CM

## 2014-09-11 ENCOUNTER — Ambulatory Visit (HOSPITAL_COMMUNITY): Payer: BC Managed Care – PPO

## 2014-09-11 ENCOUNTER — Ambulatory Visit (HOSPITAL_COMMUNITY)
Admission: RE | Admit: 2014-09-11 | Discharge: 2014-09-11 | Disposition: A | Discharge status: Home / Self Care | Payer: BC Managed Care – PPO | Source: Ambulatory Visit | Attending: Obstetrics and Gynecology | Admitting: Obstetrics and Gynecology

## 2014-09-11 DIAGNOSIS — Z1231 Encounter for screening mammogram for malignant neoplasm of breast: Secondary | ICD-10-CM

## 2014-09-11 IMAGING — MG MM SCREENING BREAST TOMO BILATERAL
6 of 10 series · 6 of 30 positions shown · non-contrast
Comparison: Previous exam(s).

CLINICAL DATA: Screening.

EXAM:
DIGITAL SCREENING BILATERAL MAMMOGRAM WITH 3D TOMO WITH CAD

[R CC]
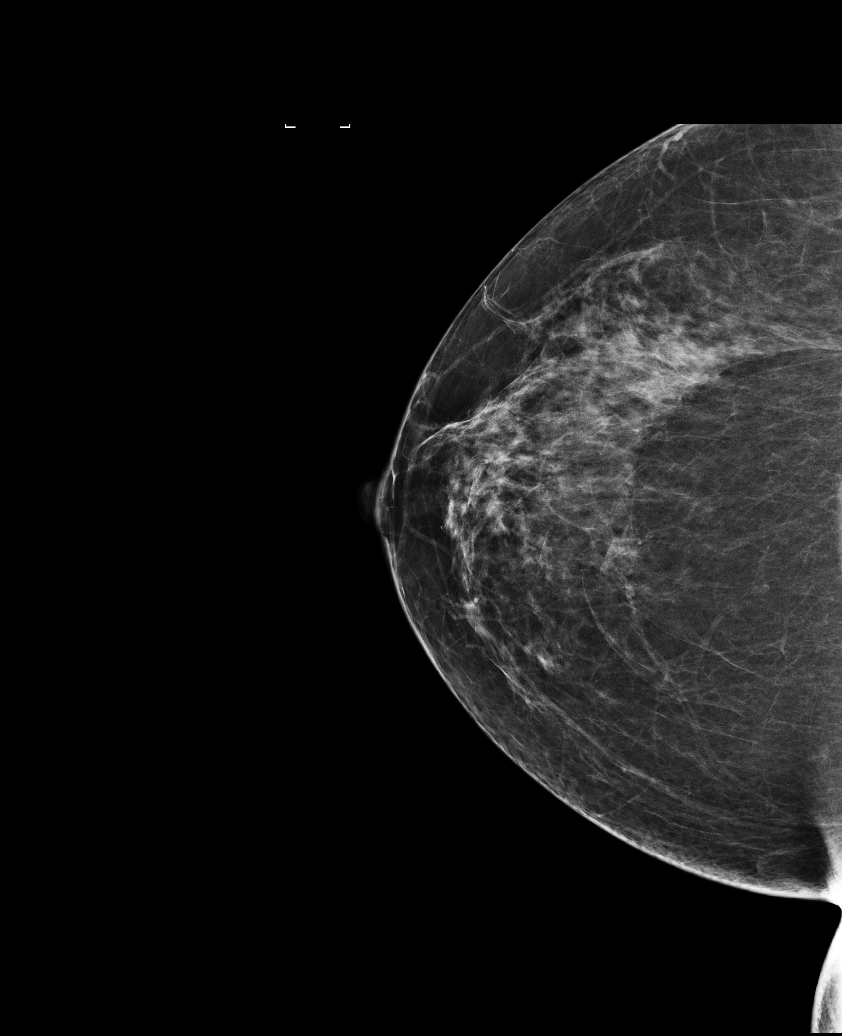

[L CC]
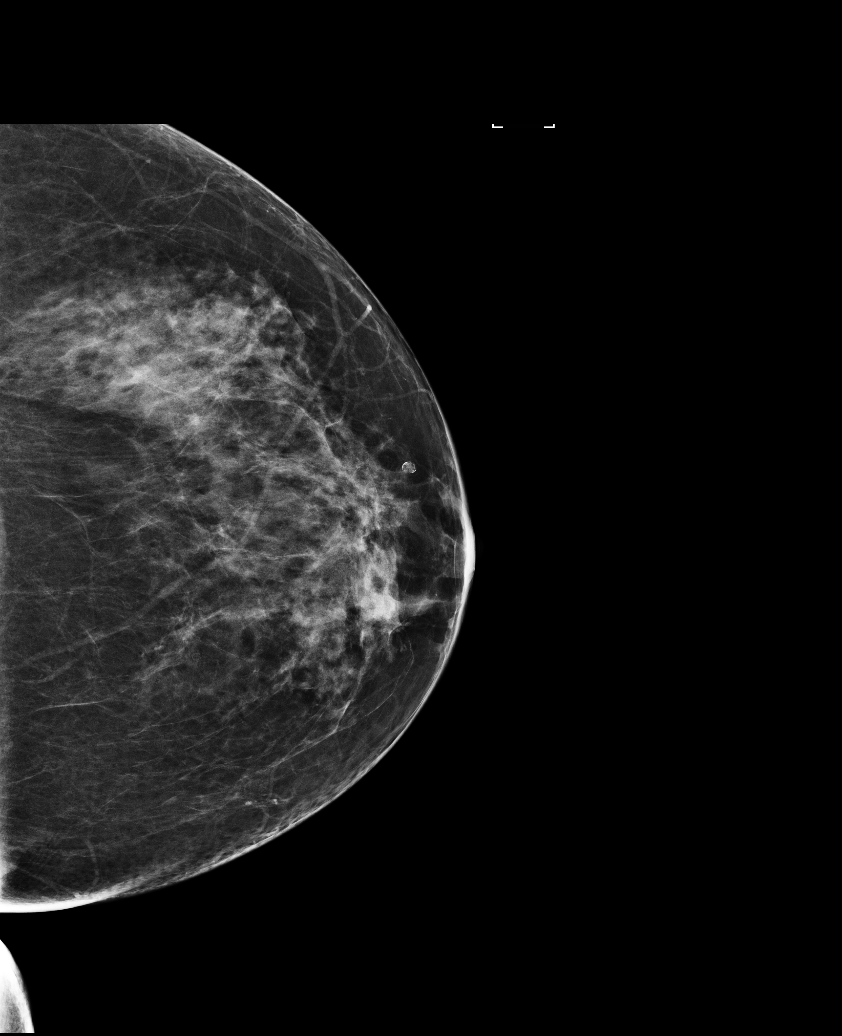

[L MLO (1 of 2)]
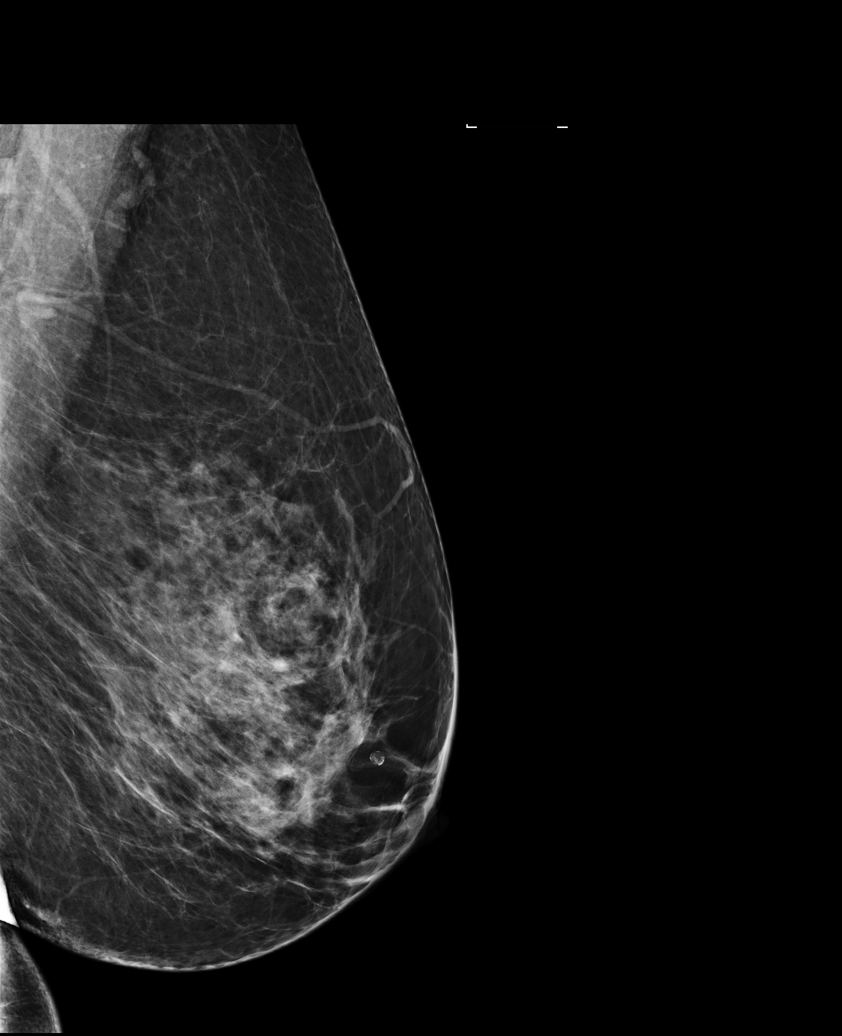

[L MLO (2 of 2)]
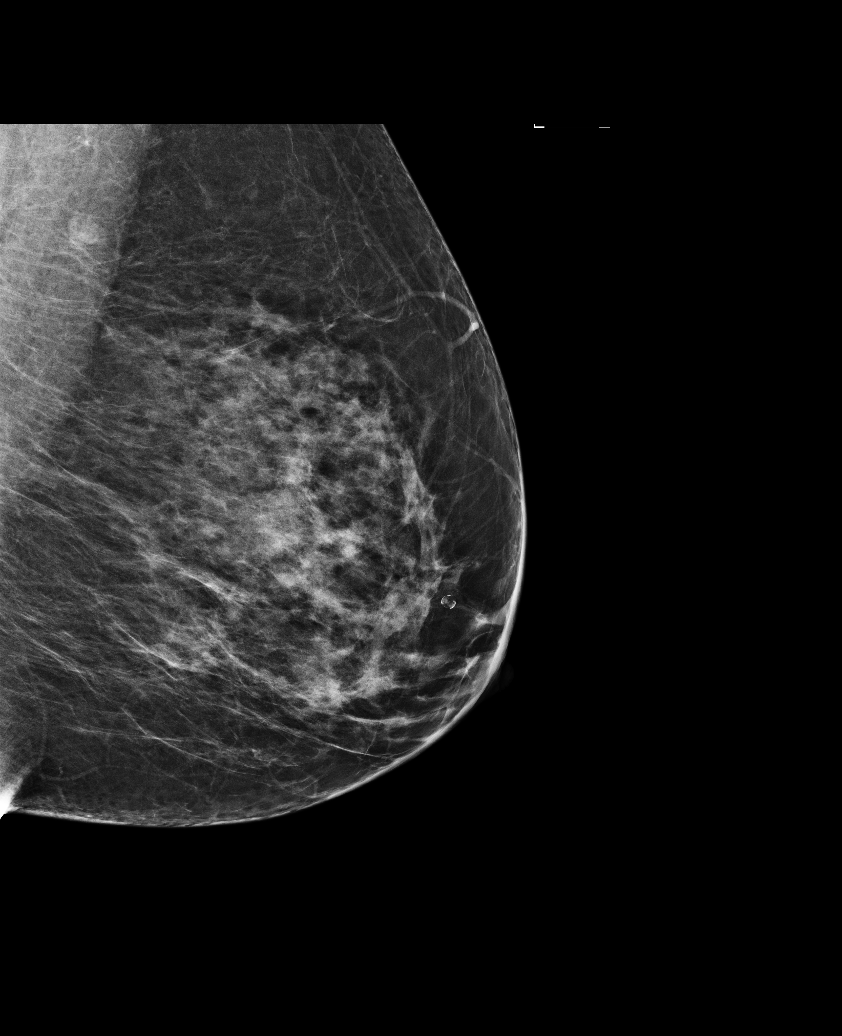

[R MLO]
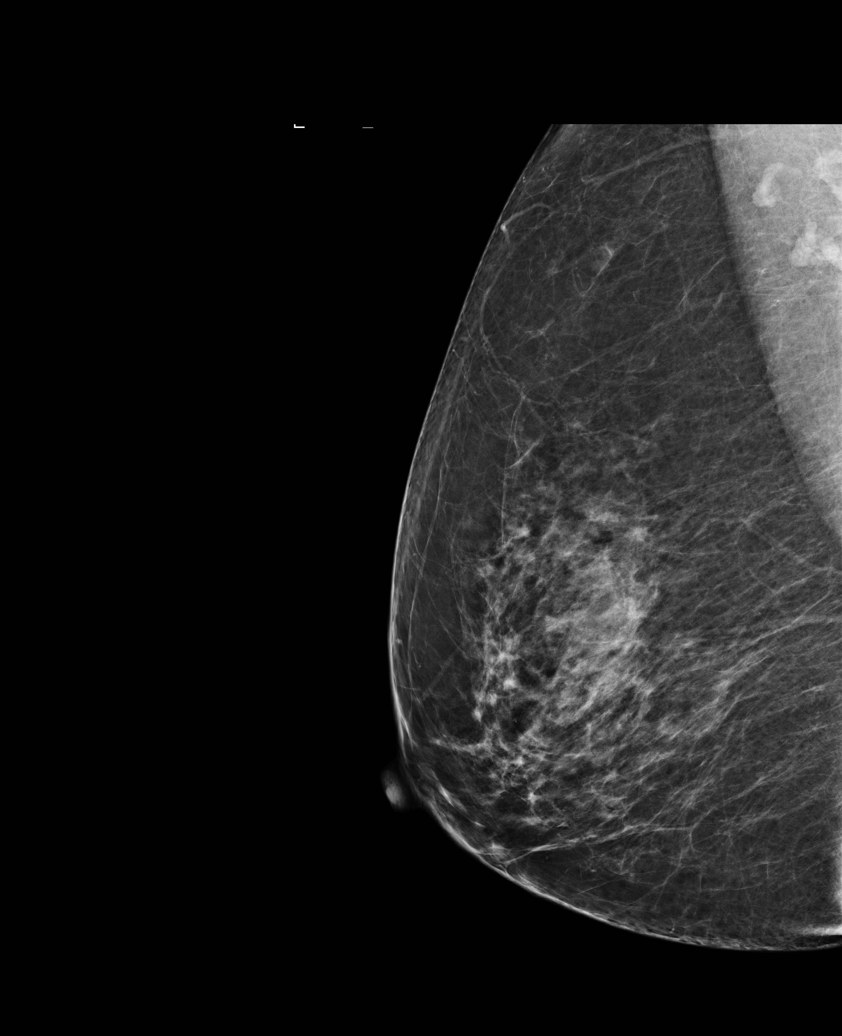

[L MLO tomo · tomo slice 44/87.0]
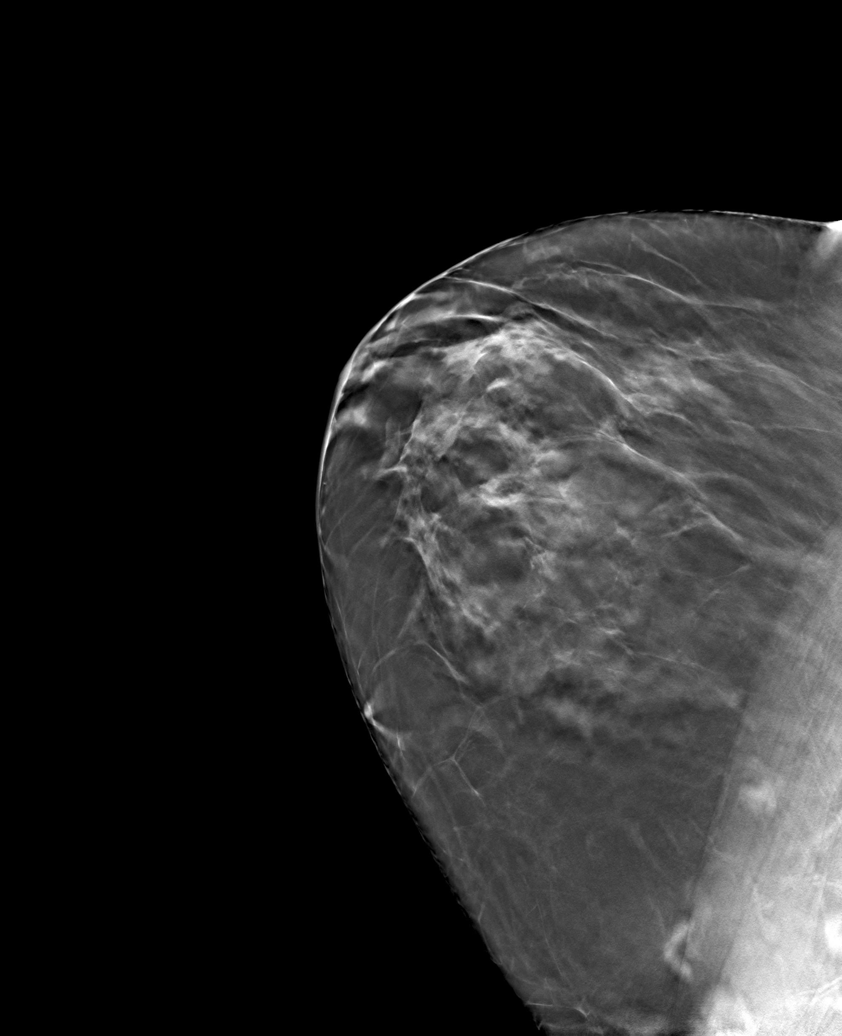

[6 of 30 positions shown; findings below may reference images not displayed]

ACR Breast Density Category c: The breast tissue is heterogeneously
dense, which may obscure small masses.
FINDINGS: There are no findings suspicious for malignancy. Images were
processed with CAD.
IMPRESSION: No mammographic evidence of malignancy. A result letter of this
screening mammogram will be mailed directly to the patient.

RECOMMENDATION:
Screening mammogram in one year. (Code:[SM])

BI-RADS CATEGORY  1: Negative.

## 2015-03-13 ENCOUNTER — Ambulatory Visit (INDEPENDENT_AMBULATORY_CARE_PROVIDER_SITE_OTHER): Payer: BC Managed Care – PPO | Admitting: Family Medicine

## 2015-03-13 ENCOUNTER — Encounter: Payer: Self-pay | Admitting: Family Medicine

## 2015-03-13 VITALS — BP 126/92 | HR 67 | Temp 98.1°F | Ht 63.0 in | Wt 182.3 lb

## 2015-03-13 DIAGNOSIS — I10 Essential (primary) hypertension: Secondary | ICD-10-CM | POA: Diagnosis not present

## 2015-03-13 DIAGNOSIS — E039 Hypothyroidism, unspecified: Secondary | ICD-10-CM | POA: Diagnosis not present

## 2015-03-13 DIAGNOSIS — Z0000 Encounter for general adult medical examination without abnormal findings: Secondary | ICD-10-CM | POA: Diagnosis not present

## 2015-03-13 DIAGNOSIS — Z1211 Encounter for screening for malignant neoplasm of colon: Secondary | ICD-10-CM | POA: Diagnosis not present

## 2015-03-13 LAB — BASIC METABOLIC PANEL
BUN: 14 mg/dL (ref 6–23)
CALCIUM: 9.8 mg/dL (ref 8.4–10.5)
CHLORIDE: 100 meq/L (ref 96–112)
CO2: 32 mEq/L (ref 19–32)
Creatinine, Ser: 0.83 mg/dL (ref 0.40–1.20)
GFR: 91.16 mL/min (ref >60.00)
Glucose, Bld: 91 mg/dL (ref 70–99)
Potassium: 4.5 mEq/L (ref 3.5–5.1)
SODIUM: 139 meq/L (ref 135–145)

## 2015-03-13 LAB — HEMOGLOBIN A1C: HEMOGLOBIN A1C: 5.5 % (ref 4.6–6.5)

## 2015-03-13 LAB — MICROALBUMIN / CREATININE URINE RATIO
CREATININE, U: 22.9 mg/dL
MICROALB/CREAT RATIO: 3.1 mg/g (ref 0.0–30.0)
Microalb, Ur: <0.7 mg/dL (ref 0.0–1.9)

## 2015-03-13 NOTE — Progress Notes (Signed)
Pre visit review using our clinic review tool, if applicable. No additional management support is needed unless otherwise documented below in the visit note. 

## 2015-03-13 NOTE — Addendum Note (Signed)
Addended by: Johnella MoloneyFUNDERBURK, JO A on: 03/13/2015 09:42 AM   Modules accepted: Orders

## 2015-03-13 NOTE — Progress Notes (Signed)
HPI:  Jamie Mayer is a 59 yo F pt of Dr. Tawanna Cooler, here for a CPE.   -Concerns and/or follow up today:   Postmenopausal Bleeding/BRBPR: -July 12th had vaginal spotting for several days -seeing Dr. Milton Ferguson for this in a few weeks -she thinks may have had some BRBPR too during this time as had some blood on stool one day during this time -denies: change in bowels, fevers, weight loss, nausea, vomiting -no bleeding since, abd pain -hx of BRBPR in the past, had colonoscopy early in 2006 and reports was normal - is due for colonoscopy and wants to get this done with [facility]  L thigh pain: -started about 4 months ago -occurs with walking a lot on the treadmill or doing stairs, and if pressure on this L lateral hip -sharp pain -denies: fevers, malaise, weakness, numbness, loss of bowel or bladder function  HTN: -elevated on check today -did not take her BP medications today - but usually does take them -tenoretic 50/25 0.5 mg daily, she thinks BP  Has been running a bit high at home -home cuff is running higher then our cuff -denies: CP, SOB, DOE, swelling, palpitation  Hypothyroid: -feels is stable -taking thyroid medication daily  -Diet: variety of foods, balance and well rounded, larger portion sizes  -Exercise: no regular exercise  -Taking folic acid, vitamin D or calcium: no  -Diabetes and Dyslipidemia Screening: FASTING today  -Vaccines: UTD  -pap history: sees Dr. Milton Ferguson for yearly gyn exams  -sexual activity: yes, female partner, no new partners  -wants STI testing (Hep C if born [birth year]): no  -FH breast, colon or ovarian ca: see FH Last mammogram: done Last colon cancer screening: referred for repeat colonsocpy  -Alcohol, Tobacco, drug use: see social history  Review of Systems - no fevers, unintentional weight loss, vision loss, hearing loss, chest pain, sob, hemoptysis, melena, hematochezia, hematuria, genital discharge, changing or  concerning skin lesions, bleeding, bruising, loc, thoughts of self harm or SI  Past Medical History  Diagnosis Date  . Allergy   . Arthritis   . DUB (dysfunctional uterine bleeding)   . Migraine     Past Surgical History  Procedure Laterality Date  . Tubal ligation    . Tonsillectomy      Family History  Problem Relation Age of Onset  . Kidney disease Mother   . Alcohol abuse Father   . Hypertension Sister   . Alcohol abuse Brother   . Hypertension Brother   . Hypertension Sister     History   Social History  . Marital Status: Married    Spouse Name: N/A  . Number of Children: N/A  . Years of Education: N/A   Social History Main Topics  . Smoking status: Never Smoker   . Smokeless tobacco: Not on file  . Alcohol Use: No  . Drug Use: No  . Sexual Activity: Not on file   Other Topics Concern  . None   Social History Narrative     Current outpatient prescriptions:  .  aspirin 81 MG EC tablet, Take 81 mg by mouth daily.  , Disp: , Rfl:  .  atenolol-chlorthalidone (TENORETIC) 50-25 MG per tablet, Half tab daily, Disp: 100 tablet, Rfl: 3 .  calcium-vitamin D 250-100 MG-UNIT per tablet, Take 1 tablet by mouth 2 (two) times daily.  , Disp: , Rfl:  .  levothyroxine (SYNTHROID, LEVOTHROID) 88 MCG tablet, TAKE 1 TABLET (88 MCG TOTAL) BY MOUTH DAILY., Disp:  90 tablet, Rfl: 3 .  Multiple Vitamin (MULITIVITAMIN WITH MINERALS) TABS, Take by mouth daily., Disp: , Rfl:  .  Omega-3 Fatty Acids (FISH OIL PO), Take by mouth., Disp: , Rfl:   EXAM:  Filed Vitals:   03/13/15 0814  BP: 126/92  Pulse: 67  Temp: 98.1 F (36.7 C)    GENERAL: vitals reviewed and listed below, alert, oriented, appears well hydrated and in no acute distress  HEENT: head atraumatic, PERRLA, normal appearance of eyes, ears, nose and mouth. moist mucus membranes.  NECK: supple, no masses or lymphadenopathy  LUNGS: clear to auscultation bilaterally, no rales, rhonchi or wheeze  CV: HRRR, no  peripheral edema or cyanosis, normal pedal pulses  BREAST: does with gyn, declined  ABDOMEN: bowel sounds normal, soft, non tender to palpation, no masses, no rebound or guarding  GU: does with gyn - declined  RECTAL: declined  SKIN: no rash or abnormal lesions  MS: normal gait, moves all extremities normally, TTP in L IT band, normal stregnth, sensation in LE bilat, normal pedal pulses, neg hip comr test, neg FABER and FADER, neg CLRT and SLDT  NEURO: CN II-XII grossly intact, normal muscle strength and sensation to light touch on extremities  PSYCH: normal affect, pleasant and cooperative  ASSESSMENT AND PLAN:  Discussed the following assessment and plan:  Visit for preventive health examination -Discussed and advised all [place] preventive services health task force level A and B recommendations for age, sex and risks.  -Advised at least 150 minutes of exercise per week and a healthy diet low in saturated fats and sweets and consisting of fresh fruits and vegetables, lean meats such as fish and white chicken and whole grains.  -FASTING labs, studies and vaccines per orders this encounter  Colon cancer screening - Plan: Ambulatory referral to Gastroenterology -my assistant called her GI office and they have advised that pt call them to set up appt, pt informed  Hypothyroidism, unspecified hypothyroidism type  Seeing gyn for her bleeding  IT band exercises and advised to follow up in 1 month if this pain persists  Essential hypertension -recheck on medication in 1 week, increase BP med if remains elevated    Orders Placed This Encounter  Procedures  . Ambulatory referral to Gastroenterology    Referral Priority:  Routine    Referral Type:  Consultation    Referral Reason:  Specialty Services Required    Number of Visits Requested:  1    Patient advised to return to clinic immediately if symptoms worsen or persist or new concerns.  Patient Instructions  BEFORE YOU  LEAVE: -labs -schedule nurse BP check in 1 week - if running high increase medication to 1 tablet daily -IT band exercises - follow up in 1 month if hip pain remains -schedule follow up with Dr Tawanna Coolerodd in 3 months  -We placed a referral for you as discussed for your colonoscopy. It usually takes about 1-2 weeks to process and schedule this referral. If you have not heard from us regarding this appointment in 2 weeks please contact our office.  -keep your appointment with your gynecologist about the bleeding  -follow up with Dr. Tawanna Coolerodd in 3 months  We recommend the following healthy lifestyle measures: - eat a healthy diet consisting of lots of vegetables, fruits, beans, nuts, seeds, healthy meats such as white chicken and fish and whole grains.  - avoid fried foods, fast food, processed foods, sodas, red meet and other fattening foods.  - get a  least 150 minutes of aerobic exercise per week.    1200mg  of calcium daily from diet, supplement only if needed 1000 IU of Vit D3 daily    Return in about 3 months (around 06/13/2015) for follow up.  Kriste Basque R.

## 2015-03-13 NOTE — Patient Instructions (Addendum)
BEFORE YOU LEAVE: -labs -schedule nurse BP check in 1 week - if running high increase medication to 1 tablet daily -IT band exercises - follow up in 1 month if hip pain remains -schedule follow up with Dr Tawanna Coolerodd in 3 months  -We placed a referral for you as discussed for your colonoscopy. It usually takes about 1-2 weeks to process and schedule this referral. If you have not heard from us regarding this appointment in 2 weeks please contact our office.  -keep your appointment with your gynecologist about the bleeding  -follow up with Dr. Tawanna Coolerodd in 3 months  We recommend the following healthy lifestyle measures: - eat a healthy diet consisting of lots of vegetables, fruits, beans, nuts, seeds, healthy meats such as white chicken and fish and whole grains.  - avoid fried foods, fast food, processed foods, sodas, red meet and other fattening foods.  - get a least 150 minutes of aerobic exercise per week.    1200mg  of calcium daily from diet, supplement only if needed 1000 IU of Vit D3 daily

## 2015-03-20 ENCOUNTER — Encounter (INDEPENDENT_AMBULATORY_CARE_PROVIDER_SITE_OTHER): Payer: BC Managed Care – PPO | Admitting: Family Medicine

## 2015-03-20 ENCOUNTER — Other Ambulatory Visit: Payer: Self-pay | Admitting: Family Medicine

## 2015-03-20 ENCOUNTER — Other Ambulatory Visit: Payer: Self-pay | Admitting: *Deleted

## 2015-03-20 ENCOUNTER — Encounter: Payer: Self-pay | Admitting: Family Medicine

## 2015-03-20 ENCOUNTER — Other Ambulatory Visit (INDEPENDENT_AMBULATORY_CARE_PROVIDER_SITE_OTHER): Payer: BC Managed Care – PPO

## 2015-03-20 VITALS — BP 130/94 | HR 65 | Temp 98.5°F | Ht 63.0 in | Wt 182.3 lb

## 2015-03-20 DIAGNOSIS — I10 Essential (primary) hypertension: Secondary | ICD-10-CM

## 2015-03-20 DIAGNOSIS — E038 Other specified hypothyroidism: Secondary | ICD-10-CM

## 2015-03-20 DIAGNOSIS — E039 Hypothyroidism, unspecified: Secondary | ICD-10-CM

## 2015-03-20 LAB — TSH: TSH: 3.51 u[IU]/mL (ref 0.35–4.50)

## 2015-03-20 NOTE — Progress Notes (Signed)
Pre visit review using our clinic review tool, if applicable. No additional management support is needed unless otherwise documented below in the visit note. 

## 2015-03-20 NOTE — Progress Notes (Signed)
  HPI:  Follow up for elevated blood pressure:  Nurse visit changed to office visit as BP remains elevated despite taking medications. Discussed options and she agrees to increasing to one tablet of her BP med daily with follow up in 1-2 months to recheck. Denies CP, SOB, DOE. Check TSH for follow up in thyroid today as well, for some reason not done at CPE - apologized to pt. ROS: See pertinent positives and negatives per HPI.  Past Medical History  Diagnosis Date  . Allergy   . Arthritis   . DUB (dysfunctional uterine bleeding)   . Migraine     Past Surgical History  Procedure Laterality Date  . Tubal ligation    . Tonsillectomy      Family History  Problem Relation Age of Onset  . Kidney disease Mother   . Alcohol abuse Father   . Hypertension Sister   . Alcohol abuse Brother   . Hypertension Brother   . Hypertension Sister     History   Social History  . Marital Status: Married    Spouse Name: N/A  . Number of Children: N/A  . Years of Education: N/A   Social History Main Topics  . Smoking status: Never Smoker   . Smokeless tobacco: Not on file  . Alcohol Use: No  . Drug Use: No  . Sexual Activity: Not on file   Other Topics Concern  . None   Social History Narrative     Current outpatient prescriptions:  .  aspirin 81 MG EC tablet, Take 81 mg by mouth daily.  , Disp: , Rfl:  .  atenolol-chlorthalidone (TENORETIC) 50-25 MG per tablet, Half tab daily, Disp: 100 tablet, Rfl: 3 .  calcium-vitamin D 250-100 MG-UNIT per tablet, Take 1 tablet by mouth 2 (two) times daily.  , Disp: , Rfl:  .  levothyroxine (SYNTHROID, LEVOTHROID) 88 MCG tablet, TAKE 1 TABLET (88 MCG TOTAL) BY MOUTH DAILY., Disp: 90 tablet, Rfl: 3 .  Multiple Vitamin (MULITIVITAMIN WITH MINERALS) TABS, Take by mouth daily., Disp: , Rfl:  .  Omega-3 Fatty Acids (FISH OIL PO), Take by mouth., Disp: , Rfl:   EXAM:  Filed Vitals:   03/20/15 0929  BP: 130/94  Pulse: 65  Temp: 98.5 F (36.9  C)    Body mass index is 32.3 kg/(m^2).  GENERAL: vitals reviewed and listed above, alert, oriented, appears well hydrated and in no acute distress  HEENT: atraumatic, conjunttiva clear, no obvious abnormalities on inspection of external nose and ears  NECK: no obvious masses on inspection  LUNGS: clear to auscultation bilaterally, no wheezes, rales or rhonchi, good air movement  CV: HRRR, no peripheral edema  MS: moves all extremities without noticeable abnormality  PSYCH: pleasant and cooperative, no obvious depression or anxiety  ASSESSMENT AND PLAN:  Discussed the following assessment and plan:  Erroneous encounter - disregard  Other specified hypothyroidism - Plan: TSH  -Patient advised to return or notify a doctor immediately if symptoms worsen or persist or new concerns arise.  Patient Instructions  -Follow up appt in 1-2 months  -please increase the atenolol-chlor to a full tablet daily      Jamie Mayer R.

## 2015-03-20 NOTE — Addendum Note (Signed)
Addended by: Bonnye Fava on: 03/20/2015 03:14 PM   Modules accepted: Orders

## 2015-03-20 NOTE — Progress Notes (Signed)
error  Jamie Basque R.   This encounter was created in error - please disregard. This encounter was created in error - please disregard.

## 2015-03-20 NOTE — Addendum Note (Signed)
Addended by: Bonnye Fava on: 03/20/2015 12:21 PM   Modules accepted: Orders

## 2015-03-20 NOTE — Patient Instructions (Signed)
-  Follow up appt in 1-2 months  -please increase the atenolol-chlor to a full tablet daily

## 2015-03-21 ENCOUNTER — Other Ambulatory Visit: Payer: Self-pay | Admitting: *Deleted

## 2015-03-21 MED ORDER — ATENOLOL-CHLORTHALIDONE 50-25 MG PO TABS: 1.0000 | ORAL_TABLET | Freq: Every day | ORAL | Status: DC

## 2015-03-28 ENCOUNTER — Other Ambulatory Visit: Payer: Self-pay | Admitting: Family Medicine

## 2015-04-10 ENCOUNTER — Encounter: Payer: Self-pay | Admitting: Family Medicine

## 2015-04-10 LAB — HM COLONOSCOPY

## 2015-05-14 ENCOUNTER — Encounter: Payer: Self-pay | Admitting: Skilled Nursing Facility1

## 2015-05-14 ENCOUNTER — Encounter: Payer: BC Managed Care – PPO | Attending: Obstetrics and Gynecology | Admitting: Skilled Nursing Facility1

## 2015-05-14 VITALS — Ht 63.0 in | Wt 183.0 lb

## 2015-05-14 DIAGNOSIS — R635 Abnormal weight gain: Secondary | ICD-10-CM | POA: Insufficient documentation

## 2015-05-14 DIAGNOSIS — Z713 Dietary counseling and surveillance: Secondary | ICD-10-CM | POA: Insufficient documentation

## 2015-05-14 NOTE — Patient Instructions (Signed)
-  Do your exercises prescribed by your doctor -Go to the gym three days a week at 10 minutes trying the oliptical or bike -Eat three meals a day and 2-3 snacks -Protein, carbohydrate, and vegetables with fruit in the morning

## 2015-05-14 NOTE — Progress Notes (Signed)
  Medical Nutrition Therapy:  Appt start time: 0800 end time:  0900.   Assessment:  Primary concerns today: referred for weight gain. Pt states she is a Jehovah witness but this does not interfere with her diet. Pt states she would like to lose weight due to possible strain on her heart. Pt states she has a lack of energy and has a low self esteem. Pts diet hx: weight watchers for three years with 10-15 pounds lost but then stopped doing weight watchers due to wanting other foods, just chicken and broccoli diet for 1 month and lost 10 pounds but then gained that weight back, slim fast but with no success. Pts usual weight 180 pounds but would like to be 160 pounds. Pt states her sleep is not good, she has trouble falling asleep. Pt states she is just retired. Pt states she is menopausal and has hot flashes in the middle of the night.      Preferred Learning Style:  No preference indicated   Learning Readiness:   Contemplating  MEDICATIONS: See List   DIETARY INTAKE:  Usual eating pattern includes 3 meals and 0 snacks per day.  Everyday foods include none stated.  Avoided foods include bread.    24-hr recall:  B ( AM): cereal  Snk ( AM): none L ( PM): grilled chicken strips, tater tot crown with ketchup Snk ( PM): none D ( PM): rice, chicken stirfry, salad with ranch Snk ( PM): none Beverages: milk, water, HiC, coffee, soda, juice  Usual physical activity: ADL's  Estimated energy needs: 1600 calories 180 g carbohydrates 120 g protein 44 g fat  Progress Towards Goal(s):  In progress.   Nutritional Diagnosis:  Elmer-3.3 Overweight/obesity As related to overconsumption of fried foods and sedentary life style.  As evidenced by pt report, 24 hr recall, and BMI 32.42.    Intervention:  Nutrition counseling for weight gain. Dietitian educated the pt on the importance of a varied/balanced diet, eating throughout the day, and physical activity.  Goals: -Do your exercises prescribed  by your doctor -Go to the gym three days a week at 10 minutes trying the oliptical or bike -Eat three meals a day and 2-3 snacks -Protein, carbohydrate, and vegetables with fruit in the morning   Teaching Method Utilized:  Visual Auditory  Handouts given during visit include:  MyPlate  Snack sheet  Barriers to learning/adherence to lifestyle change: new lifestyles  Demonstrated degree of understanding via:  Teach Back   Monitoring/Evaluation:  Dietary intake, exercise, and body weight prn.

## 2015-06-13 ENCOUNTER — Ambulatory Visit (INDEPENDENT_AMBULATORY_CARE_PROVIDER_SITE_OTHER): Payer: BC Managed Care – PPO | Admitting: Family Medicine

## 2015-06-13 ENCOUNTER — Encounter: Payer: Self-pay | Admitting: Family Medicine

## 2015-06-13 VITALS — BP 118/80 | Temp 97.6°F | Wt 181.0 lb

## 2015-06-13 DIAGNOSIS — M25552 Pain in left hip: Secondary | ICD-10-CM

## 2015-06-13 DIAGNOSIS — E669 Obesity, unspecified: Secondary | ICD-10-CM

## 2015-06-13 DIAGNOSIS — Z23 Encounter for immunization: Secondary | ICD-10-CM

## 2015-06-13 DIAGNOSIS — I10 Essential (primary) hypertension: Secondary | ICD-10-CM | POA: Diagnosis not present

## 2015-06-13 NOTE — Patient Instructions (Signed)
Continue dietary program  When you're able............ walk 30 minutes daily  Call Dr. Norlene CampbellPeter Whitfield..... Orthopedist....... for evaluation of your left hip pain  Motrin 400 mg twice daily with food intake you see Dr. Cleophas DunkerWhitfield  Continue current blood pressure medication  Blood pressure check weekly at home

## 2015-06-13 NOTE — Progress Notes (Signed)
   Subjective:    Patient ID: Jamie Mayer, female    DOB: 03-Jul-1956, 59 y.o.   MRN: [id number]  HPI Jamie Mayer is a 59 year old female who comes in today for evaluation of 3 problems  She was here a month ago her weight was 183 pounds. She was asked by Dr. Tylene FantasiaKamm to start a diet and exercise program. She started dietary program and has lost 2 pounds. She went to [facility]. She's not start a walking program yet because of severe left hip pain  Her left hip is been bothering her now for about 7 months. 5 years ago she fell but had no trauma at the time she does not remember anything other than just a fall 5 years ago. 7 [place] and left hip and now she can't walk.  BP on Tenoretic 50-25 at home 113/80. She gets nervous when she comes in here and her blood pressure goes up   Review of Systems Review of systems otherwise negative    Objective:   Physical Exam  Or develop well-nourished female no acute distress vital signs stable she's afebrile in the supine position both legs were of equal length. The right ankle knee and hip are normal. The left ankle and knee are normal the hip shows marked pain with external rotation around 30      Assessment & Plan:  Hypertension at goal with normal blood pressure at home........ continue current medication  Obesity.......... continue diet.......... add walking when she can walk  Left hip pain...........Marland Kitchen. refer to Dr. Norlene CampbellPeter Whitfield for further evaluation

## 2015-06-13 NOTE — Progress Notes (Signed)
Pre visit review using our clinic review tool, if applicable. No additional management support is needed unless otherwise documented below in the visit note. 

## 2015-06-16 ENCOUNTER — Other Ambulatory Visit: Payer: Self-pay | Admitting: Orthopaedic Surgery

## 2015-06-16 DIAGNOSIS — M25552 Pain in left hip: Secondary | ICD-10-CM

## 2015-06-20 ENCOUNTER — Other Ambulatory Visit: Payer: Self-pay | Admitting: *Deleted

## 2015-06-20 MED ORDER — ATENOLOL-CHLORTHALIDONE 50-25 MG PO TABS: 1.0000 | ORAL_TABLET | Freq: Every day | ORAL | Status: DC

## 2015-07-04 ENCOUNTER — Ambulatory Visit
Admission: RE | Admit: 2015-07-04 | Discharge: 2015-07-04 | Disposition: A | Discharge status: Home / Self Care | Payer: BC Managed Care – PPO | Source: Ambulatory Visit | Attending: Orthopaedic Surgery | Admitting: Orthopaedic Surgery

## 2015-07-04 DIAGNOSIS — M25552 Pain in left hip: Secondary | ICD-10-CM

## 2015-07-04 MED ORDER — IOHEXOL 180 MG/ML  SOLN
18.0000 mL | Freq: Once | INTRAMUSCULAR | Status: DC | PRN
  Administered 2015-07-04: 18 mL via INTRA_ARTICULAR

## 2015-08-06 ENCOUNTER — Other Ambulatory Visit: Payer: Self-pay

## 2015-08-06 DIAGNOSIS — Z1231 Encounter for screening mammogram for malignant neoplasm of breast: Secondary | ICD-10-CM

## 2015-08-28 ENCOUNTER — Encounter: Payer: Self-pay | Admitting: Family Medicine

## 2015-08-28 ENCOUNTER — Ambulatory Visit (INDEPENDENT_AMBULATORY_CARE_PROVIDER_SITE_OTHER): Payer: BC Managed Care – PPO | Admitting: Family Medicine

## 2015-08-28 ENCOUNTER — Telehealth: Payer: Self-pay | Admitting: Family Medicine

## 2015-08-28 VITALS — BP 100/70 | HR 72 | Temp 97.6°F | Wt 190.0 lb

## 2015-08-28 DIAGNOSIS — K5900 Constipation, unspecified: Secondary | ICD-10-CM | POA: Diagnosis not present

## 2015-08-28 DIAGNOSIS — R1032 Left lower quadrant pain: Secondary | ICD-10-CM | POA: Diagnosis not present

## 2015-08-28 NOTE — Patient Instructions (Signed)
Constipation Continue 64 oz water a day Start walking 30 minutes 5 days a week or other exercise which helps constipation Use miralax 1 capful daily over next month Continue to bulk stools with fiber- fruits and veggies key- small amount of prune juice 4 oz a day.   If continued issues after 3 weeks, blood in stool, thinner stools- return to see Jamie Mayer

## 2015-08-28 NOTE — Telephone Encounter (Signed)
noted 

## 2015-08-28 NOTE — Progress Notes (Signed)
Tana ConchStephen Hunter, MD  Subjective:  Jamie OxfordBarbara E Mayer is a 60 y.o. year old very pleasant female patient who presents for/with See problem oriented charting ROS- no bright red blood per rectum, no melena. No unintentional weight loss.   Past Medical History- obesity, hypertension, asthma, hypothyroidism  Medications- reviewed and updated Current Outpatient Prescriptions  Medication Sig Dispense Refill  . aspirin 81 MG EC tablet Take 81 mg by mouth daily.      Marland Kitchen. atenolol-chlorthalidone (TENORETIC) 50-25 MG tablet Take 1 tablet by mouth daily. 90 tablet 2  . calcium-vitamin D 250-100 MG-UNIT per tablet Take 1 tablet by mouth 2 (two) times daily.      Marland Kitchen. levothyroxine (SYNTHROID, LEVOTHROID) 88 MCG tablet TAKE 1 TABLET (88 MCG TOTAL) BY MOUTH DAILY. 30 tablet 11  . Multiple Vitamin (MULITIVITAMIN WITH MINERALS) TABS Take by mouth daily.     Objective: BP 100/70 mmHg  Pulse 72  Temp(Src) 97.6 F (36.4 C)  Wt 190 lb (86.183 kg)  LMP 08/20/2011 Gen: NAD, resting comfortably CV: RRR no murmurs rubs or gallops Lungs: CTAB no crackles, wheeze, rhonchi Abdomen: soft/mild lower left and right abdominal pain/nondistended/normal bowel sounds. No rebound or guarding.  Ext: no edema Skin: warm, dry Neuro: grossly normal, moves all extremities  Assessment/Plan:  Lower abdominal Pain Constipation S:Started 28th of December- ate dinner, saw movie then noted some lower abdominal pain and bloating. With lower abdominal pain had no urinary symptoms but was concerned so saw GYN and Urine neg for infection. She then wondered if constipation could be at play as she noted thin narrow stools and less volume. She felt like her Input less than output. Mother has history of ruptured colon of unclear cause but was not constipation related but this was patients fear.Started prune juice and benefiber and eating raisin bran. Started dulcolax. Going to the bathroom daily but thin like her finger and not large volume.  Colonoscopy normal in 03/2015 Dr. Elnoria HowardHung except for diverticulosis. Started drinking 64 oz water. Not exercising. Weight is up 9 lbs over last few months.  A/P: We discussed lower abdominal pain could certainly come from constipation. Other sources possible but start with treating constipation effectively.  h20 intake good. Exercise lacking- encouraged. Increase fruits/veggies to get fiber not just add benefiber. Dulcolax ok but miralax may have more durability so advised to change to that and use capful daily for 1 month. Return in 3 weeks if continued issues. Discussed small caliber stool not likely major concern given recent colonoscopy and very low risk of malignancy in colon. Return precautions advised. Also see avs.

## 2015-08-28 NOTE — Telephone Encounter (Signed)
Patient Name: Jamie MerlBARBARA Mayer  DOB: [birth year]    Initial Comment Caller states she has constipation.   Nurse Assessment  Nurse: Sherilyn CooterHenry, RN, Thurmond ButtsWade Date/Time Lamount Cohen(Eastern Time): 08/28/2015 9:17:53 AM  Confirm and document reason for call. If symptomatic, describe symptoms. ---Caller states that she has had problems with constipation. Last BM was this morning and did not have problems having BM. Nearly the end of the month of December, she was having some thin narrow stools. Still having the thin narrow stools. Denies pain. States she did see some blood in her stool 2 days ago, but not since then.  Has the patient traveled out of the country within the last 30 days? ---No  Does the patient have any new or worsening symptoms? ---Yes  Will a triage be completed? ---Yes  Related visit to physician within the last 2 weeks? ---No  Does the PT have any chronic conditions? (i.e. diabetes, asthma, etc.) ---Yes  List chronic conditions. ---HTN, Hypothyroidism, Diverticulosis  Is this a behavioral health or substance abuse call? ---No     Guidelines    Guideline Title Affirmed Question Affirmed Notes  Constipation Pencil-like, narrow stools    Final Disposition User   See PCP When Office is Open (within 3 days) Sherilyn CooterHenry, RN, Thurmond ButtsWade    Comments  Caller requested to be seen today. Prefers not to see a NP. Appointment scheduled with Dr. Tana ConchStephen Hunter for 4:30pm today.   Referrals  REFERRED TO PCP OFFICE   Disagree/Comply: Comply

## 2015-09-14 ENCOUNTER — Ambulatory Visit: Payer: BC Managed Care – PPO

## 2015-09-17 ENCOUNTER — Ambulatory Visit
Admission: RE | Admit: 2015-09-17 | Discharge: 2015-09-17 | Disposition: A | Discharge status: Home / Self Care | Payer: BC Managed Care – PPO | Source: Ambulatory Visit

## 2015-09-17 DIAGNOSIS — Z1231 Encounter for screening mammogram for malignant neoplasm of breast: Secondary | ICD-10-CM

## 2015-12-23 ENCOUNTER — Other Ambulatory Visit: Payer: Self-pay | Admitting: Family Medicine

## 2015-12-31 ENCOUNTER — Telehealth: Payer: Self-pay | Admitting: Family Medicine

## 2015-12-31 MED ORDER — ATENOLOL-CHLORTHALIDONE 50-25 MG PO TABS: 1.0000 | ORAL_TABLET | Freq: Every day | ORAL | Status: DC

## 2015-12-31 NOTE — Telephone Encounter (Signed)
Pt states Dr Tawanna Coolerodd increased her  atenolol-chlorthalidone (TENORETIC) 50-25 MG tablet to 1 tab / a day and not 1/2 as directions state.  Pt [facility] [place] [facility] [employer]  with the current instructions for 1 /day.  Pt got charged for a 2 month supply

## 2015-12-31 NOTE — Telephone Encounter (Signed)
Refill sent.

## 2016-04-04 ENCOUNTER — Other Ambulatory Visit: Payer: Self-pay | Admitting: Family Medicine

## 2016-04-14 ENCOUNTER — Other Ambulatory Visit: Payer: Self-pay | Admitting: Obstetrics and Gynecology

## 2016-04-14 DIAGNOSIS — N6002 Solitary cyst of left breast: Secondary | ICD-10-CM

## 2016-04-16 ENCOUNTER — Ambulatory Visit
Admission: RE | Admit: 2016-04-16 | Discharge: 2016-04-16 | Disposition: A | Discharge status: Home / Self Care | Payer: BC Managed Care – PPO | Source: Ambulatory Visit | Attending: Obstetrics and Gynecology | Admitting: Obstetrics and Gynecology

## 2016-04-16 DIAGNOSIS — N6002 Solitary cyst of left breast: Secondary | ICD-10-CM

## 2016-06-30 ENCOUNTER — Other Ambulatory Visit (INDEPENDENT_AMBULATORY_CARE_PROVIDER_SITE_OTHER): Payer: BC Managed Care – PPO

## 2016-06-30 DIAGNOSIS — Z0000 Encounter for general adult medical examination without abnormal findings: Secondary | ICD-10-CM

## 2016-06-30 LAB — CBC WITH DIFFERENTIAL/PLATELET
Basophils Absolute: 0 10*3/uL (ref 0.0–0.1)
Basophils Relative: 0.5 % (ref 0.0–3.0)
EOS PCT: 1.5 % (ref 0.0–5.0)
Eosinophils Absolute: 0.1 10*3/uL (ref 0.0–0.7)
HCT: 39.6 % (ref 36.0–46.0)
Hemoglobin: 13.4 g/dL (ref 12.0–15.0)
LYMPHS ABS: 2 10*3/uL (ref 0.7–4.0)
Lymphocytes Relative: 32.9 % (ref 12.0–46.0)
MCHC: 33.8 g/dL (ref 30.0–36.0)
MCV: 83.5 fl (ref 78.0–100.0)
Monocytes Absolute: 0.5 10*3/uL (ref 0.1–1.0)
Monocytes Relative: 7.7 % (ref 3.0–12.0)
NEUTROS ABS: 3.5 10*3/uL (ref 1.4–7.7)
NEUTROS PCT: 57.4 % (ref 43.0–77.0)
PLATELETS: 285 10*3/uL (ref 150.0–400.0)
RBC: 4.74 Mil/uL (ref 3.87–5.11)
RDW: 16 % — AB (ref 11.5–15.5)
WBC: 6.1 10*3/uL (ref 4.0–10.5)

## 2016-06-30 LAB — LIPID PANEL
CHOLESTEROL: 190 mg/dL (ref 0–200)
HDL: 63.4 mg/dL (ref >39.00)
LDL CALC: 108 mg/dL — AB (ref 0–99)
NonHDL: 127.08
TRIGLYCERIDES: 95 mg/dL (ref 0.0–149.0)
Total CHOL/HDL Ratio: 3
VLDL: 19 mg/dL (ref 0.0–40.0)

## 2016-06-30 LAB — BASIC METABOLIC PANEL
BUN: 16 mg/dL (ref 6–23)
CO2: 33 meq/L — AB (ref 19–32)
Calcium: 9.4 mg/dL (ref 8.4–10.5)
Chloride: 98 mEq/L (ref 96–112)
Creatinine, Ser: 0.85 mg/dL (ref 0.40–1.20)
GFR: 88.28 mL/min (ref >60.00)
GLUCOSE: 93 mg/dL (ref 70–99)
POTASSIUM: 3.2 meq/L — AB (ref 3.5–5.1)
Sodium: 138 mEq/L (ref 135–145)

## 2016-06-30 LAB — POC URINALSYSI DIPSTICK (AUTOMATED)
BILIRUBIN UA: NEGATIVE
Glucose, UA: NEGATIVE
Ketones, UA: NEGATIVE
Leukocytes, UA: NEGATIVE
NITRITE UA: NEGATIVE
PH UA: 5.5
Protein, UA: NEGATIVE
RBC UA: NEGATIVE
UROBILINOGEN UA: 0.2

## 2016-06-30 LAB — HEPATIC FUNCTION PANEL
ALBUMIN: 3.7 g/dL (ref 3.5–5.2)
ALT: 15 U/L (ref 0–35)
AST: 17 U/L (ref 0–37)
Alkaline Phosphatase: 74 U/L (ref 39–117)
BILIRUBIN TOTAL: 0.5 mg/dL (ref 0.2–1.2)
Bilirubin, Direct: 0.1 mg/dL (ref 0.0–0.3)
Total Protein: 6.9 g/dL (ref 6.0–8.3)

## 2016-06-30 LAB — TSH: TSH: 1.4 u[IU]/mL (ref 0.35–4.50)

## 2016-07-07 ENCOUNTER — Ambulatory Visit (INDEPENDENT_AMBULATORY_CARE_PROVIDER_SITE_OTHER): Payer: BC Managed Care – PPO | Admitting: Family Medicine

## 2016-07-07 ENCOUNTER — Encounter: Payer: Self-pay | Admitting: Family Medicine

## 2016-07-07 VITALS — BP 120/80 | HR 60 | Temp 97.7°F | Ht 62.75 in | Wt 179.5 lb

## 2016-07-07 DIAGNOSIS — S76212A Strain of adductor muscle, fascia and tendon of left thigh, initial encounter: Secondary | ICD-10-CM

## 2016-07-07 DIAGNOSIS — Z23 Encounter for immunization: Secondary | ICD-10-CM | POA: Diagnosis not present

## 2016-07-07 DIAGNOSIS — Z0000 Encounter for general adult medical examination without abnormal findings: Secondary | ICD-10-CM | POA: Diagnosis not present

## 2016-07-07 DIAGNOSIS — I10 Essential (primary) hypertension: Secondary | ICD-10-CM | POA: Diagnosis not present

## 2016-07-07 DIAGNOSIS — E038 Other specified hypothyroidism: Secondary | ICD-10-CM | POA: Diagnosis not present

## 2016-07-07 MED ORDER — LEVOTHYROXINE SODIUM 88 MCG PO TABS: ORAL_TABLET | ORAL | 3 refills | Status: DC

## 2016-07-07 MED ORDER — ATENOLOL-CHLORTHALIDONE 50-25 MG PO TABS: 1.0000 | ORAL_TABLET | Freq: Every day | ORAL | 3 refills | Status: DC

## 2016-07-07 MED ORDER — HYDROCORTISONE ACE-PRAMOXINE 2.5-1 % RE CREA: TOPICAL_CREAM | RECTAL | 5 refills | Status: DC

## 2016-07-07 NOTE — Progress Notes (Signed)
Pre visit review using our clinic review tool, if applicable. No additional management support is needed unless otherwise documented below in the visit note. 

## 2016-07-07 NOTE — Progress Notes (Signed)
Jamie Mayer is a 60 year old married female nonsmoker who comes in today for general physical examination because of a history of hypertension and hypothyroidism  She takes Synthroid 88 g daily for hypothyroidism. TSH level is 1.40  She takes Tenoretic 50-25 daily for hypertension BP 120/80 compliant with medicine no side effects  She gets routine eye care, dental care, BSE monthly, annual mammography, colonoscopy 2016 was normal.  She had a GYN exam this past year with [name] all was normal. She is 8 years post menopause. No respiratory trouble with her Pap smears. Asked her to discuss with [name] every 3 years in the future.  She's had pain in her left groin for about 3 years. It comes and goes. We sent her for an orthopedic evaluation a year ago and that was all normal. She still has a persistent pain. Hasn't gotten a better had gotten a worse. She describes the pain is dull comes and goes usually hurts when she walks. She points the left groin as a source of her discomfort. Again no history of trauma  She has a keloid scar in it to chest wall from previous lesion. It was a just a bug bite and now she has a very large keloid.  Vaccinations up-to-date seasonal flu shot given today  Physical evaluation.vs  BP 120/80 (BP Location: Left Arm, Patient Position: Sitting, Cuff Size: Normal)   Pulse 60   Temp 97.7 F (36.5 C) (Oral)   Ht 5' 2.75" (1.594 m)   Wt 179 lb 8 oz (81.4 kg)   LMP 08/20/2011   BMI 32.05 kg/m  Examination of the HEENT were negative neck was supple no adenopathy thyroid normal no carotid bruits cardiopulmonary exam normal breast exam deferred done by GYN. Abdominal exam normal extremities normal skin no peripheral pulses normal. Examination of left groin shows no adenopathy. Pulses are normal hips normal. There is some tenderness with the at the inguinal ligament.  #1 hypothyroidism.....Marland Kitchen. continue Synthroid  #2 hypertension at goal.....Marland Kitchen. continue  Tenoretic  #3 pain left groin....... left inguinal strain.......Marland Kitchen. Motrin 600 twice a day ice  Overweight....... discussed diet exercise and continue weight loss. She states she walks everyday she is a Scientist, product/process developmentJehovah's Witness

## 2016-07-07 NOTE — Patient Instructions (Signed)
Work hard this year on weight loss.......... carbohydrate free diet  Continue walking  Motrin 600 mg twice daily with food and ice to that left groin when it sore  Return in one year for general physical exam sooner if any problems  Raynelle FanningJulie or [employer].......... are 2 new adult nurse practitioner's..... Call in July for your physical exam in November..........Marland Kitchen

## 2016-07-08 ENCOUNTER — Telehealth: Payer: Self-pay | Admitting: Family Medicine

## 2016-07-08 NOTE — Telephone Encounter (Signed)
Pt seen yesterday and would like to know what cream she should use on her  Keloid. Will dr call in a script ot is it OTC?  [facility]/ [address]

## 2016-07-09 ENCOUNTER — Other Ambulatory Visit: Payer: Self-pay | Admitting: Emergency Medicine

## 2016-07-09 MED ORDER — FLUOCINONIDE 0.05 % EX GEL: 1.0000 "application " | Freq: Once | CUTANEOUS | 2 refills | Status: AC

## 2016-07-09 NOTE — Telephone Encounter (Signed)
Called and spoke with pt informing her that Lidex has been called into pharmacy. Pt verbalized understanding nothing further needed at this time.

## 2016-08-13 ENCOUNTER — Other Ambulatory Visit: Payer: Self-pay | Admitting: Obstetrics and Gynecology

## 2016-08-13 DIAGNOSIS — Z1231 Encounter for screening mammogram for malignant neoplasm of breast: Secondary | ICD-10-CM

## 2016-08-30 ENCOUNTER — Encounter (HOSPITAL_COMMUNITY): Payer: Self-pay | Admitting: Emergency Medicine

## 2016-08-30 ENCOUNTER — Emergency Department (HOSPITAL_COMMUNITY): Payer: BC Managed Care – PPO

## 2016-08-30 ENCOUNTER — Emergency Department (HOSPITAL_COMMUNITY)
Admission: EM | Admit: 2016-08-30 | Discharge: 2016-08-30 | Disposition: A | Discharge status: Home / Self Care | Payer: BC Managed Care – PPO | Attending: Emergency Medicine | Admitting: Emergency Medicine

## 2016-08-30 DIAGNOSIS — R112 Nausea with vomiting, unspecified: Secondary | ICD-10-CM | POA: Diagnosis not present

## 2016-08-30 DIAGNOSIS — J181 Lobar pneumonia, unspecified organism: Secondary | ICD-10-CM | POA: Diagnosis not present

## 2016-08-30 DIAGNOSIS — Z7982 Long term (current) use of aspirin: Secondary | ICD-10-CM | POA: Diagnosis not present

## 2016-08-30 DIAGNOSIS — I10 Essential (primary) hypertension: Secondary | ICD-10-CM | POA: Insufficient documentation

## 2016-08-30 DIAGNOSIS — E039 Hypothyroidism, unspecified: Secondary | ICD-10-CM | POA: Insufficient documentation

## 2016-08-30 DIAGNOSIS — J189 Pneumonia, unspecified organism: Secondary | ICD-10-CM

## 2016-08-30 DIAGNOSIS — R509 Fever, unspecified: Secondary | ICD-10-CM | POA: Diagnosis present

## 2016-08-30 LAB — CBC
HCT: 40.6 % (ref 36.0–46.0)
HEMOGLOBIN: 13.6 g/dL (ref 12.0–15.0)
MCH: 27.6 pg (ref 26.0–34.0)
MCHC: 33.5 g/dL (ref 30.0–36.0)
MCV: 82.4 fL (ref 78.0–100.0)
Platelets: 275 10*3/uL (ref 150–400)
RBC: 4.93 MIL/uL (ref 3.87–5.11)
RDW: 15.8 % — AB (ref 11.5–15.5)
WBC: 10.8 10*3/uL — ABNORMAL HIGH (ref 4.0–10.5)

## 2016-08-30 LAB — URINALYSIS, ROUTINE W REFLEX MICROSCOPIC
BILIRUBIN URINE: NEGATIVE
Glucose, UA: NEGATIVE mg/dL
HGB URINE DIPSTICK: NEGATIVE
KETONES UR: NEGATIVE mg/dL
Leukocytes, UA: NEGATIVE
NITRITE: NEGATIVE
PH: 7 (ref 5.0–8.0)
Protein, ur: NEGATIVE mg/dL
SPECIFIC GRAVITY, URINE: 1.015 (ref 1.005–1.030)

## 2016-08-30 LAB — COMPREHENSIVE METABOLIC PANEL
ALBUMIN: 3.9 g/dL (ref 3.5–5.0)
ALK PHOS: 83 U/L (ref 38–126)
ALT: 18 U/L (ref 14–54)
AST: 28 U/L (ref 15–41)
Anion gap: 10 (ref 5–15)
BILIRUBIN TOTAL: 0.7 mg/dL (ref 0.3–1.2)
BUN: 14 mg/dL (ref 6–20)
CO2: 27 mmol/L (ref 22–32)
CREATININE: 0.92 mg/dL (ref 0.44–1.00)
Calcium: 9 mg/dL (ref 8.9–10.3)
Chloride: 98 mmol/L — ABNORMAL LOW (ref 101–111)
GFR calc Af Amer: >60 mL/min (ref >60)
GFR calc non Af Amer: >60 mL/min (ref >60)
GLUCOSE: 139 mg/dL — AB (ref 65–99)
Potassium: 3.6 mmol/L (ref 3.5–5.1)
SODIUM: 135 mmol/L (ref 135–145)
Total Protein: 8.1 g/dL (ref 6.5–8.1)

## 2016-08-30 LAB — LIPASE, BLOOD: Lipase: 17 U/L (ref 11–51)

## 2016-08-30 MED ORDER — ACETAMINOPHEN 325 MG PO TABS
650.0000 mg | ORAL_TABLET | Freq: Once | ORAL | Status: AC
  Administered 2016-08-30: 650 mg via ORAL
  Filled 2016-08-30: qty 2

## 2016-08-30 MED ORDER — ONDANSETRON HCL 4 MG/2ML IJ SOLN
4.0000 mg | Freq: Once | INTRAMUSCULAR | Status: AC
  Administered 2016-08-30: 4 mg via INTRAVENOUS
  Filled 2016-08-30: qty 2

## 2016-08-30 MED ORDER — DICYCLOMINE HCL 20 MG PO TABS: 20.0000 mg | ORAL_TABLET | Freq: Two times a day (BID) | ORAL | 0 refills | Status: DC

## 2016-08-30 MED ORDER — MORPHINE SULFATE (PF) 4 MG/ML IV SOLN
4.0000 mg | Freq: Once | INTRAVENOUS | Status: AC
  Administered 2016-08-30: 4 mg via INTRAVENOUS
  Filled 2016-08-30: qty 1

## 2016-08-30 MED ORDER — NAPROXEN 250 MG PO TABS: 250.0000 mg | ORAL_TABLET | Freq: Two times a day (BID) | ORAL | 0 refills | Status: DC

## 2016-08-30 MED ORDER — SODIUM CHLORIDE 0.9 % IV BOLUS (SEPSIS)
1000.0000 mL | Freq: Once | INTRAVENOUS | Status: AC
  Administered 2016-08-30: 1000 mL via INTRAVENOUS

## 2016-08-30 MED ORDER — DICYCLOMINE HCL 10 MG PO CAPS
10.0000 mg | ORAL_CAPSULE | Freq: Once | ORAL | Status: AC
  Administered 2016-08-30: 10 mg via ORAL
  Filled 2016-08-30: qty 1

## 2016-08-30 MED ORDER — LEVOFLOXACIN 750 MG PO TABS: 750.0000 mg | ORAL_TABLET | Freq: Every day | ORAL | 0 refills | Status: DC

## 2016-08-30 MED ORDER — IBUPROFEN 800 MG PO TABS
800.0000 mg | ORAL_TABLET | Freq: Once | ORAL | Status: AC
  Administered 2016-08-30: 800 mg via ORAL
  Filled 2016-08-30: qty 1

## 2016-08-30 MED ORDER — ONDANSETRON 4 MG PO TBDP: 4.0000 mg | ORAL_TABLET | Freq: Three times a day (TID) | ORAL | 0 refills | Status: DC | PRN

## 2016-08-30 MED ORDER — ONDANSETRON HCL 4 MG/2ML IJ SOLN
4.0000 mg | INTRAMUSCULAR | Status: AC
  Administered 2016-08-30: 4 mg via INTRAVENOUS
  Filled 2016-08-30: qty 2

## 2016-08-30 NOTE — ED Notes (Signed)
Pt transported to xray 

## 2016-08-30 NOTE — ED Provider Notes (Signed)
[facility]-EMERGENCY DEPT Provider Note   CSN: [id number] Arrival date & time: 08/30/16  1137     History   Chief Complaint Chief Complaint  Patient presents with  . Abdominal Pain  . Emesis    HPI Jamie Mayer is a 61 y.o. female.  Jamie Mayer is a 61 y.o. Female who presents to the emergency department complaining of nausea, vomiting, fevers and chills since yesterday. Patient reports she began yesterday with a fever of 101. She reports her husband had a similar illness a few days prior with flulike symptoms. She reports she been having nausea and vomiting yesterday and has vomited twice today. No hematemesis. No diarrhea. She denies any abdominal pain but complains of nausea. She reports having chills and feeling lightheaded with position change. Motrin last night for her fevers. Previous internal surgical history includes a tubal ligation. She also reports some associated slight cough and postnasal drip. Patient denies urinary symptoms, vaginal bleeding, vaginal discharge, chest pain, shortness of breath, rashes, syncope, hematemesis or hematochezia. Patient no longer has menstrual cycles.   The history is provided by the patient. No language interpreter was used.  Abdominal Pain   Associated symptoms include fever, nausea and vomiting. Pertinent negatives include diarrhea, dysuria, frequency and headaches.  Emesis   Associated symptoms include chills, cough and a fever. Pertinent negatives include no abdominal pain, no diarrhea and no headaches.    Past Medical History:  Diagnosis Date  . Allergy   . Arthritis   . DUB (dysfunctional uterine bleeding)   . Migraine     Patient Active Problem List   Diagnosis Date Noted  . Obesity 06/13/2015  . Essential hypertension 08/29/2008  . ALLERGIC RHINITIS 06/08/2008  . Hypothyroidism 11/05/2007    Past Surgical History:  Procedure Laterality Date  . TONSILLECTOMY    . TUBAL LIGATION      OB History    No data  available       Home Medications    Prior to Admission medications   Medication Sig Start Date End Date Taking? Authorizing Provider  aspirin 81 MG EC tablet Take 81 mg by mouth daily.     Yes Historical Provider, MD  atenolol-chlorthalidone (TENORETIC) 50-25 MG tablet Take 1 tablet by mouth daily. 07/07/16  Yes Roderick Pee, MD  calcium citrate-vitamin D 500-400 MG-UNIT chewable tablet Chew 1 tablet by mouth 3 (three) times daily.    Yes Historical Provider, MD  levothyroxine (SYNTHROID, LEVOTHROID) 88 MCG tablet TAKE 1 TABLET (88 MCG TOTAL) BY MOUTH DAILY. 07/07/16  Yes Roderick Pee, MD  Multiple Vitamin (MULITIVITAMIN WITH MINERALS) TABS Take by mouth daily.   Yes Historical Provider, MD  dicyclomine (BENTYL) 20 MG tablet Take 1 tablet (20 mg total) by mouth 2 (two) times daily. 08/30/16   Everlene Farrier, PA-C  hydrocortisone-pramoxine [facility]) 2.5-1 % rectal cream Apply daily at bedtime when necessary Patient not taking: Reported on 08/30/2016 07/07/16   Roderick Pee, MD  levofloxacin (LEVAQUIN) 750 MG tablet Take 1 tablet (750 mg total) by mouth daily. 08/30/16   Everlene Farrier, PA-C  naproxen (NAPROSYN) 250 MG tablet Take 1 tablet (250 mg total) by mouth 2 (two) times daily with a meal. 08/30/16   Everlene Farrier, PA-C  ondansetron (ZOFRAN ODT) 4 MG disintegrating tablet Take 1 tablet (4 mg total) by mouth every 8 (eight) hours as needed for nausea or vomiting. 08/30/16   Everlene Farrier, PA-C    Family History Family History  Problem  Relation Age of Onset  . Kidney disease Mother   . Alcohol abuse Father   . Hypertension Sister   . Alcohol abuse Brother   . Hypertension Brother   . Hypertension Sister     Social History Social History  Substance Use Topics  . Smoking status: Never Smoker  . Smokeless tobacco: Never Used  . Alcohol use No     Allergies   Sulfa antibiotics   Review of Systems Review of Systems  Constitutional: Positive for chills and fever.  HENT:  Negative for congestion and sore throat.   Eyes: Negative for visual disturbance.  Respiratory: Positive for cough. Negative for shortness of breath.   Cardiovascular: Negative for chest pain.  Gastrointestinal: Positive for nausea and vomiting. Negative for abdominal pain, blood in stool and diarrhea.  Genitourinary: Negative for difficulty urinating, dysuria, frequency, menstrual problem, urgency, vaginal bleeding and vaginal discharge.  Musculoskeletal: Negative for back pain and neck pain.  Skin: Negative for rash.  Neurological: Positive for light-headedness. Negative for dizziness, syncope, weakness and headaches.     Physical Exam Updated Vital Signs BP 114/77 (BP Location: Left Arm)   Pulse 113   Temp 99.4 F (37.4 C) (Oral)   Resp 16   LMP 08/20/2011   SpO2 96%   Physical Exam  Constitutional: She appears well-developed and well-nourished. No distress.  Nontoxic appearing.  HENT:  Head: Normocephalic and atraumatic.  Mouth/Throat: Oropharynx is clear and moist.  Eyes: Conjunctivae are normal. Pupils are equal, round, and reactive to light. Right eye exhibits no discharge. Left eye exhibits no discharge.  Neck: Neck supple.  Cardiovascular: Normal rate, regular rhythm, normal heart sounds and intact distal pulses.  Exam reveals no gallop and no friction rub.   No murmur heard. Heart rate is 104.  Pulmonary/Chest: Effort normal and breath sounds normal. No respiratory distress. She has no wheezes. She has no rales.  Lungs clear to auscultation bilaterally.  Abdominal: Soft. Bowel sounds are normal. She exhibits no distension. There is no tenderness. There is no guarding.  Abdomen is soft and nontender to palpation.  Musculoskeletal: She exhibits no edema or tenderness.  Lymphadenopathy:    She has no cervical adenopathy.  Neurological: She is alert. Coordination normal.  Skin: Skin is warm and dry. Capillary refill takes less than 2 seconds. No rash noted. She is not  diaphoretic. No erythema. No pallor.  Psychiatric: She has a normal mood and affect. Her behavior is normal.  Nursing note and vitals reviewed.    ED Treatments / Results  Labs (all labs ordered are listed, but only abnormal results are displayed) Labs Reviewed  COMPREHENSIVE METABOLIC PANEL - Abnormal; Notable for the following:       Result Value   Chloride 98 (*)    Glucose, Bld 139 (*)    All other components within normal limits  CBC - Abnormal; Notable for the following:    WBC 10.8 (*)    RDW 15.8 (*)    All other components within normal limits  LIPASE, BLOOD  URINALYSIS, ROUTINE W REFLEX MICROSCOPIC    EKG  EKG Interpretation None       Radiology Dg Chest 2 View  Result Date: 08/30/2016 CLINICAL DATA:  Nausea, vomiting, and chills. EXAM: CHEST  2 VIEW COMPARISON:  March 08, 2014 FINDINGS: Mild opacity in the left lung base could represent atelectasis or early infiltrate. No other acute abnormalities or changes. IMPRESSION: Mild opacity in left lung base could represent atelectasis versus early  infiltrate. Recommend follow-up to resolution. Electronically Signed   By: Gerome Samavid  Williams III M.D   On: 08/30/2016 15:07    Procedures Procedures (including critical care time)  Medications Ordered in ED Medications  sodium chloride 0.9 % bolus 1,000 mL (0 mLs Intravenous Stopped 08/30/16 1441)  ondansetron (ZOFRAN) injection 4 mg (4 mg Intravenous Given 08/30/16 1257)  morphine 4 MG/ML injection 4 mg (4 mg Intravenous Given 08/30/16 1257)  acetaminophen (TYLENOL) tablet 650 mg (650 mg Oral Given 08/30/16 1441)  ibuprofen (ADVIL,MOTRIN) tablet 800 mg (800 mg Oral Given 08/30/16 1441)  sodium chloride 0.9 % bolus 1,000 mL (1,000 mLs Intravenous New Bag/Given 08/30/16 1511)  ondansetron (ZOFRAN) injection 4 mg (4 mg Intravenous Given 08/30/16 1547)  dicyclomine (BENTYL) capsule 10 mg (10 mg Oral Given 08/30/16 1547)     Initial Impression / Assessment and Plan / ED Course  I have  reviewed the triage vital signs and the nursing notes.  Pertinent labs & imaging results that were available during my care of the patient were reviewed by me and considered in my medical decision making (see chart for details).  Clinical Course    This is a 61 y.o. Female who presents to the emergency department complaining of nausea, vomiting, fevers and chills since yesterday. Patient reports she began yesterday with a fever of 101. She reports her husband had a similar illness a few days prior with flulike symptoms. She reports she been having nausea and vomiting yesterday and has vomited twice today. No hematemesis. No diarrhea. She denies any abdominal pain but complains of nausea. She reports having chills and feeling lightheaded with position change. Motrin last night for her fevers. Previous internal surgical history includes a tubal ligation. She also reports some associated slight cough and postnasal drip. On arrival to the emergency department the patient is afebrile. On exam she is afebrile nontoxic appearing. Abdomen is soft and nontender to palpation. Lungs clear to auscultation bilaterally. She is a slight tachycardia at a heart rate of 104. Urinalysis is without sign of infection. Lipase is within normal limits. CMP is unremarkable. CBC is remarkable only for white count of 10,800. Reevaluation patient was found to have a temperature 102 and a slight tachycardia 108. Will provide with Tylenol and ibuprofen and reevaluate. Will also obtain chest x-ray. Chest x-ray shows mild opacity in the left lung base which could represent atelectasis versus early infiltrate. At reevaluation patient is still having some mild tachycardia, however her temperature has improved to 99.4.  I discussed the chest x-ray findings and blood work again with the patient. We'll go ahead and treat her with Levaquin for pneumonia. Patient receiving a second liter fluid bolus per she still complaining of some slight  lightheadedness with position change.   At shift change patient is awaiting completion of her second fluid bolus. Patient care signed out to Melburn HakeNicole Nadeau, PA-C who will reevaluate the patient and plan is for likely discharge if patient's lightheadedness resolves and she can tolerate PO.   Final Clinical Impressions(s) / ED Diagnoses   Final diagnoses:  Non-intractable vomiting with nausea, unspecified vomiting type  Community acquired pneumonia of left lower lobe of lung (HCC)    New Prescriptions New Prescriptions   DICYCLOMINE (BENTYL) 20 MG TABLET    Take 1 tablet (20 mg total) by mouth 2 (two) times daily.   LEVOFLOXACIN (LEVAQUIN) 750 MG TABLET    Take 1 tablet (750 mg total) by mouth daily.   NAPROXEN (NAPROSYN) 250 MG TABLET  Take 1 tablet (250 mg total) by mouth 2 (two) times daily with a meal.   ONDANSETRON (ZOFRAN ODT) 4 MG DISINTEGRATING TABLET    Take 1 tablet (4 mg total) by mouth every 8 (eight) hours as needed for nausea or vomiting.     Everlene Farrier, PA-C 08/30/16 1604    Shaune Pollack, MD 09/01/16 [phone]

## 2016-08-30 NOTE — ED Triage Notes (Signed)
Pt complaint of abdominal pain with associated nausea, vomiting, and chills. Pt denies diarrhea. Onset last night.

## 2016-08-30 NOTE — ED Provider Notes (Signed)
Hand-off from American ExpressWill Dansie, PA-C.   See initial provider's note for full HPI  Briefly, pt is a 61 yo female with PMH of HTN who presents to the ED with complaint of N/V, fever and chills since yesterday. Reports husband with similar sxs a few days ago. Reports she has been unable to keep fluids down today. Also reports having mild associated cough and rhinorrhea.    Physical Exam  BP 133/80   Pulse 95   Temp 98.7 F (37.1 C) (Oral)   Resp 16   LMP 08/20/2011   SpO2 96%   Physical Exam  Constitutional: She is oriented to person, place, and time. She appears well-developed and well-nourished. No distress.  HENT:  Head: Normocephalic and atraumatic.  Mouth/Throat: Oropharynx is clear and moist. No oropharyngeal exudate.  Eyes: Conjunctivae and EOM are normal. Right eye exhibits no discharge. Left eye exhibits no discharge. No scleral icterus.  Neck: Normal range of motion. Neck supple.  Cardiovascular: Normal rate, regular rhythm, normal heart sounds and intact distal pulses.   HR 95  Pulmonary/Chest: Effort normal and breath sounds normal. No respiratory distress. She has no wheezes. She has no rales. She exhibits no tenderness.  Abdominal: Soft. Bowel sounds are normal. She exhibits no distension and no mass. There is no tenderness. There is no rebound and no guarding. No hernia.  Musculoskeletal: Normal range of motion. She exhibits no edema.  Lymphadenopathy:    She has no cervical adenopathy.  Neurological: She is alert and oriented to person, place, and time.  Skin: Skin is warm and dry. No rash noted. She is not diaphoretic.  Nursing note and vitals reviewed.   ED Course  Procedures  MDM Patient presents with nausea, vomiting, chills and fever for the past 2 days. Reports her husband was sick a few days ago with similar flulike symptoms. She states she has been unable to keep anything down today. Patient initially afebrile on arrival to the ED, exam performed by initial  provider revealed nontoxic-appearing female, benign abdominal exam, lungs clear to auscultation bilaterally. Patient was noted to be slightly tachycardic with heart rate 104. WBC 10.8. Remaining labs and UA unremarkable. Reevaluation revealed temp 102, heart rate 108. Patient given Tylenol, ibuprofen and IV fluids. Chest x-ray showed mild opacity in the left lung base that could represent atelectasis versus early infiltrate. Plan to treat patient with Levaquin for suspected pneumonia. Reevaluation by initial provider reeled patient with mild tachycardia, temp improved to 99.4. Plan to give patient second liter of fluids due to patient continuing to complain of mild lightheadedness with positional change.  On my initial valuation patient is laying in bed resting comfortably. Repeat vitals revealed patient to remain afebrile, heart rate 95. Exam unremarkable. Patient reports improvement of lightheadedness after second liter of IV fluids. Patient able to tolerate PO. Discussed results and plan for discharge with patient. Plan to discharge patient home with levofloxacin for pneumonia and dental Anaprox and for symptomatically treatment. Advised patient to follow up with her PCP for follow-up evaluation. Discussed return precautions.       Satira Sarkicole Elizabeth Sun PrairieNadeau, [place] JerseyPA-C 08/30/16 1809    Lorre NickAnthony Allen, MD 09/02/16 [phone]

## 2016-09-16 ENCOUNTER — Encounter: Payer: Self-pay | Admitting: Family Medicine

## 2016-09-16 ENCOUNTER — Ambulatory Visit (INDEPENDENT_AMBULATORY_CARE_PROVIDER_SITE_OTHER): Payer: BC Managed Care – PPO | Admitting: Family Medicine

## 2016-09-16 VITALS — BP 110/80 | HR 64 | Temp 98.1°F | Ht 62.0 in | Wt 183.0 lb

## 2016-09-16 DIAGNOSIS — A084 Viral intestinal infection, unspecified: Secondary | ICD-10-CM | POA: Insufficient documentation

## 2016-09-16 NOTE — Progress Notes (Signed)
Pre visit review using our clinic review tool, if applicable. No additional management support is needed unless otherwise documented below in the visit note. 

## 2016-09-16 NOTE — Progress Notes (Signed)
[name] is a 61 year old married female nonsmoker who comes in today following an emergency room visit January 6  She presented emergently with nausea and vomiting. She was given IV fluids. CBC electrolytes were normal. Chest x-ray showed atelectasis however she was given Levaquin for reasons that are not clear.  She's here today for follow-up saying she feels fine  BP 110/80 (BP Location: Left Arm, Patient Position: Sitting, Cuff Size: Normal)   Pulse 64   Temp 98.1 F (36.7 C) (Oral)   Ht 5\' 2"  (1.575 m)   Wt 183 lb (83 kg)   LMP 08/20/2011   SpO2 97%   BMI 33.47 kg/m  Examination HEENT were negative cardiopulmonary exam normal  Impression viral gastroenteritis resolved with IV fluids in the emergency room January 6  Chest x-ray atelectasis no pneumonia

## 2016-09-16 NOTE — Patient Instructions (Signed)
Return when necessary 

## 2016-09-17 ENCOUNTER — Ambulatory Visit
Admission: RE | Admit: 2016-09-17 | Discharge: 2016-09-17 | Disposition: A | Discharge status: Home / Self Care | Payer: BC Managed Care – PPO | Source: Ambulatory Visit | Attending: Obstetrics and Gynecology | Admitting: Obstetrics and Gynecology

## 2016-09-17 DIAGNOSIS — Z1231 Encounter for screening mammogram for malignant neoplasm of breast: Secondary | ICD-10-CM

## 2017-05-14 ENCOUNTER — Encounter: Payer: Self-pay | Admitting: Family Medicine

## 2017-05-14 ENCOUNTER — Ambulatory Visit (INDEPENDENT_AMBULATORY_CARE_PROVIDER_SITE_OTHER): Payer: BC Managed Care – PPO | Admitting: Family Medicine

## 2017-05-14 VITALS — BP 120/88 | HR 49 | Temp 97.6°F | Ht 62.0 in | Wt 172.0 lb

## 2017-05-14 DIAGNOSIS — M545 Low back pain, unspecified: Secondary | ICD-10-CM

## 2017-05-14 MED ORDER — DICLOFENAC SODIUM 75 MG PO TBEC: 75.0000 mg | DELAYED_RELEASE_TABLET | Freq: Two times a day (BID) | ORAL | 0 refills | Status: DC

## 2017-05-14 MED ORDER — CYCLOBENZAPRINE HCL 10 MG PO TABS: 10.0000 mg | ORAL_TABLET | Freq: Three times a day (TID) | ORAL | 0 refills | Status: AC | PRN

## 2017-05-14 NOTE — Progress Notes (Signed)
   Subjective:    Patient ID: PRESLEIGH FELDSTEIN, female    DOB: [birth year], 61 y.o.   MRN: [id number]  HPI Here for one month of intermittent sharp pains in the lower back. No hx of trauma. No pain or numbness in the legs. She has not tried anything for this yet.    Review of Systems  Constitutional: Negative.   Respiratory: Negative.   Cardiovascular: Negative.   Gastrointestinal: Negative.   Genitourinary: Negative.   Musculoskeletal: Positive for back pain.       Objective:   Physical Exam  Constitutional: She appears well-developed and well-nourished.  Cardiovascular: Normal rate, regular rhythm, normal heart sounds and intact distal pulses.   Pulmonary/Chest: Effort normal and breath sounds normal. No respiratory distress. She has no wheezes. She has no rales.  Musculoskeletal:  Tender over the central lower back. No spasm. Full ROM. Negative SLR.           Assessment & Plan:  Low back pain. Use heat and stretches. Try Diclofenac and Flexeril. Recheck prn. Gershon Crane, MD

## 2017-05-14 NOTE — Patient Instructions (Signed)
WE NOW OFFER   [facility] FAST TRACK!!!  SAME DAY Appointments for ACUTE CARE  Such as: Sprains, Injuries, cuts, abrasions, rashes, muscle pain, joint pain, back pain Colds, flu, sore throats, headache, allergies, cough, fever  Ear pain, sinus and eye infections Abdominal pain, nausea, vomiting, diarrhea, upset stomach Animal/insect bites  3 Easy Ways to Schedule: Walk-In Scheduling Call in scheduling Mychart Sign-up: [email or web address]         

## 2017-06-18 ENCOUNTER — Other Ambulatory Visit: Payer: Self-pay | Admitting: Family Medicine

## 2017-07-13 ENCOUNTER — Telehealth: Payer: Self-pay | Admitting: Family Medicine

## 2017-07-13 ENCOUNTER — Ambulatory Visit (INDEPENDENT_AMBULATORY_CARE_PROVIDER_SITE_OTHER): Payer: BC Managed Care – PPO | Admitting: Family Medicine

## 2017-07-13 ENCOUNTER — Encounter: Payer: Self-pay | Admitting: Family Medicine

## 2017-07-13 VITALS — BP 120/88 | HR 54 | Temp 97.5°F | Ht 62.0 in | Wt 174.0 lb

## 2017-07-13 DIAGNOSIS — Z23 Encounter for immunization: Secondary | ICD-10-CM

## 2017-07-13 DIAGNOSIS — I10 Essential (primary) hypertension: Secondary | ICD-10-CM | POA: Diagnosis not present

## 2017-07-13 DIAGNOSIS — Z0000 Encounter for general adult medical examination without abnormal findings: Secondary | ICD-10-CM

## 2017-07-13 DIAGNOSIS — E038 Other specified hypothyroidism: Secondary | ICD-10-CM

## 2017-07-13 LAB — POCT URINALYSIS DIPSTICK
Bilirubin, UA: NEGATIVE
Blood, UA: NEGATIVE
Glucose, UA: NEGATIVE
Ketones, UA: NEGATIVE
LEUKOCYTES UA: NEGATIVE
NITRITE UA: NEGATIVE
PH UA: 7 (ref 5.0–8.0)
PROTEIN UA: NEGATIVE
Spec Grav, UA: 1.01 (ref 1.010–1.025)
Urobilinogen, UA: 0.2 E.U./dL

## 2017-07-13 LAB — HEPATIC FUNCTION PANEL
ALBUMIN: 3.7 g/dL (ref 3.5–5.2)
ALK PHOS: 80 U/L (ref 39–117)
ALT: 15 U/L (ref 0–35)
AST: 18 U/L (ref 0–37)
Bilirubin, Direct: 0.1 mg/dL (ref 0.0–0.3)
TOTAL PROTEIN: 6.5 g/dL (ref 6.0–8.3)
Total Bilirubin: 0.6 mg/dL (ref 0.2–1.2)

## 2017-07-13 LAB — CBC WITH DIFFERENTIAL/PLATELET
BASOS ABS: 0 10*3/uL (ref 0.0–0.1)
Basophils Relative: 0.9 % (ref 0.0–3.0)
Eosinophils Absolute: 0.1 10*3/uL (ref 0.0–0.7)
Eosinophils Relative: 1.5 % (ref 0.0–5.0)
HEMATOCRIT: 41.9 % (ref 36.0–46.0)
HEMOGLOBIN: 13.8 g/dL (ref 12.0–15.0)
LYMPHS ABS: 1.6 10*3/uL (ref 0.7–4.0)
LYMPHS PCT: 37.3 % (ref 12.0–46.0)
MCHC: 32.9 g/dL (ref 30.0–36.0)
MCV: 86.7 fl (ref 78.0–100.0)
MONOS PCT: 9.6 % (ref 3.0–12.0)
Monocytes Absolute: 0.4 10*3/uL (ref 0.1–1.0)
Neutro Abs: 2.2 10*3/uL (ref 1.4–7.7)
Neutrophils Relative %: 50.7 % (ref 43.0–77.0)
Platelets: 301 10*3/uL (ref 150.0–400.0)
RBC: 4.83 Mil/uL (ref 3.87–5.11)
RDW: 15.7 % — ABNORMAL HIGH (ref 11.5–15.5)
WBC: 4.4 10*3/uL (ref 4.0–10.5)

## 2017-07-13 LAB — TSH: TSH: 1.74 u[IU]/mL (ref 0.35–4.50)

## 2017-07-13 LAB — LIPID PANEL
CHOL/HDL RATIO: 3
Cholesterol: 180 mg/dL (ref 0–200)
HDL: 58.5 mg/dL (ref >39.00)
LDL Cholesterol: 103 mg/dL — ABNORMAL HIGH (ref 0–99)
NonHDL: 121.32
TRIGLYCERIDES: 90 mg/dL (ref 0.0–149.0)
VLDL: 18 mg/dL (ref 0.0–40.0)

## 2017-07-13 LAB — BASIC METABOLIC PANEL
BUN: 16 mg/dL (ref 6–23)
CHLORIDE: 99 meq/L (ref 96–112)
CO2: 33 mEq/L — ABNORMAL HIGH (ref 19–32)
Calcium: 9.2 mg/dL (ref 8.4–10.5)
Creatinine, Ser: 0.79 mg/dL (ref 0.40–1.20)
GFR: 95.72 mL/min (ref >60.00)
Glucose, Bld: 86 mg/dL (ref 70–99)
POTASSIUM: 3.6 meq/L (ref 3.5–5.1)
SODIUM: 138 meq/L (ref 135–145)

## 2017-07-13 MED ORDER — LEVOTHYROXINE SODIUM 88 MCG PO TABS: ORAL_TABLET | ORAL | 4 refills | Status: DC

## 2017-07-13 MED ORDER — ATENOLOL-CHLORTHALIDONE 50-25 MG PO TABS: 1.0000 | ORAL_TABLET | Freq: Every day | ORAL | 4 refills | Status: DC

## 2017-07-13 NOTE — Telephone Encounter (Signed)
Spoke with pt voiced understanding the instructions on how to use the ear cleaning kit.

## 2017-07-13 NOTE — Telephone Encounter (Signed)
Copied from CRM [id number]. Topic: General - Other >> Jul 13, 2017 11:46 AM Percival SpanishKennedy, Cheryl W wrote:   Pt call to say she saw Dr todd today and he gave her some instruction about cleaning her ears. She call to ask if Dr Tawanna Coolerodd could write those instructions out and or give her a call back with the instructions    [phone]

## 2017-07-13 NOTE — Patient Instructions (Signed)
Continue current medications and exercise program.........Marland Kitchen. go carbohydrate free  Labs today .......... I will call you if there is anything abnormal  Call your insurance And find out where he get the shingles vaccine

## 2017-07-13 NOTE — Progress Notes (Signed)
Jamie Mayer is a 61 year old married female nonsmoker who comes in today for general physical examination because of a history of hypertension hypothyroidism  For hypertension she's been on Tenoretic 50-25 daily for many years. BP normal 120/80  She's been on Synthroid 88 g daily because of a history of hypothyroidism. TSH level be done today  She gets routine eye care, dental care, BSE monthly, annual mammography, colonoscopy 2016 normal.  Vaccinations tetanus was 2013 P. Seasonal flu shot given today. Information given on shingles vaccine. Offered screening for hepatitis C declined low risk category.  14 point review of systems June otherwise negative  LMP 9 years ago. She saw Dr. Milton FergusonGraywall in August for GYN check everything was normal. She never had trouble with her Pap smears in the past.  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 Abdominal exam was negative except for keloids mid lower abdomen from previous GYN surgery. She also has a keloid on left upper chest wall from a cut years ago. Abdominal exam otherwise negative pelvic and rectal by GYN therefore not repeated. Extremities normal skin normal peripheral pulses normal  #1 hypertension at goal.....Marland Kitchen. continue current therapy check labs  #2 hypothyroidism........ continue Synthroid check labs  #3 slightly overweight.......... advised to continue exercise go carbohydrate free.Marland Kitchen..Marland Kitchen

## 2017-11-16 ENCOUNTER — Other Ambulatory Visit: Payer: Self-pay | Admitting: Family Medicine

## 2017-11-17 NOTE — Telephone Encounter (Signed)
Last OV seen by Dr. Tawanna Coolerodd 07/13/2017   Last seen by Dr. Clent RidgesFry 05/14/2017   Last refilled 05/14/2017   Sent to PCP for approval

## 2017-12-02 ENCOUNTER — Other Ambulatory Visit: Payer: Self-pay | Admitting: Gastroenterology

## 2017-12-02 DIAGNOSIS — R1084 Generalized abdominal pain: Secondary | ICD-10-CM

## 2017-12-04 ENCOUNTER — Ambulatory Visit
Admission: RE | Admit: 2017-12-04 | Discharge: 2017-12-04 | Disposition: A | Discharge status: Home / Self Care | Payer: BC Managed Care – PPO | Source: Ambulatory Visit | Attending: Gastroenterology | Admitting: Gastroenterology

## 2017-12-04 DIAGNOSIS — R1084 Generalized abdominal pain: Secondary | ICD-10-CM

## 2018-04-15 ENCOUNTER — Ambulatory Visit (INDEPENDENT_AMBULATORY_CARE_PROVIDER_SITE_OTHER): Payer: BC Managed Care – PPO | Admitting: Family Medicine

## 2018-04-15 ENCOUNTER — Encounter: Payer: Self-pay | Admitting: Family Medicine

## 2018-04-15 VITALS — BP 118/84 | HR 64 | Temp 98.7°F | Wt 185.0 lb

## 2018-04-15 DIAGNOSIS — K649 Unspecified hemorrhoids: Secondary | ICD-10-CM

## 2018-04-15 DIAGNOSIS — L309 Dermatitis, unspecified: Secondary | ICD-10-CM

## 2018-04-15 MED ORDER — HYDROXYZINE HCL 25 MG PO TABS: 25.0000 mg | ORAL_TABLET | Freq: Three times a day (TID) | ORAL | 0 refills | Status: AC | PRN

## 2018-04-15 MED ORDER — TRIAMCINOLONE ACETONIDE 0.1 % EX CREA: 1.0000 "application " | TOPICAL_CREAM | Freq: Two times a day (BID) | CUTANEOUS | 0 refills | Status: AC

## 2018-04-15 MED ORDER — HYDROCORTISONE ACE-PRAMOXINE 2.5-1 % RE CREA: TOPICAL_CREAM | RECTAL | 1 refills | Status: AC

## 2018-04-15 NOTE — Patient Instructions (Signed)
Rash A rash is a change in the color of the skin. A rash can also change the way your skin feels. There are many different conditions and factors that can cause a rash. Follow these instructions at home: Pay attention to any changes in your symptoms. Follow these instructions to help with your condition: Medicine Take or apply over-the-counter and prescription medicines only as told by your health care provider. These may include:  Corticosteroid cream.  Anti-itch lotions.  Oral antihistamines.  Skin Care  Apply cool compresses to the affected areas.  Try taking a bath with: ? Epsom salts. Follow the instructions on the packaging. You can get these at your local pharmacy or grocery store. ? Baking soda. Pour a small amount into the bath as told by your health care provider. ? Colloidal oatmeal. Follow the instructions on the packaging. You can get this at your local pharmacy or grocery store.  Try applying baking soda paste to your skin. Stir water into baking soda until it reaches a paste-like consistency.  Do not scratch or rub your skin.  Avoid covering the rash. Make sure the rash is exposed to air as much as possible. General instructions  Avoid hot showers or baths, which can make itching worse. A cold shower may help.  Avoid scented soaps, detergents, and perfumes. Use gentle soaps, detergents, perfumes, and other cosmetic products.  Avoid any substance that causes your rash. Keep a journal to help track what causes your rash. Write down: ? What you eat. ? What cosmetic products you use. ? What you drink. ? What you wear. This includes jewelry.  Keep all follow-up visits as told by your health care provider. This is important. Contact a health care provider if:  You sweat at night.  You lose weight.  You urinate more than normal.  You feel weak.  You vomit.  Your skin or the whites of your eyes look yellow (jaundice).  Your skin: ? Tingles. ? Is  numb.  Your rash: ? Does not go away after several days. ? Gets worse.  You are: ? Unusually thirsty. ? More tired than normal.  You have: ? New symptoms. ? Pain in your abdomen. ? A fever. ? Diarrhea. Get help right away if:  You develop a rash that covers all or most of your body. The rash may or may not be painful.  You develop blisters that: ? Are on top of the rash. ? Grow larger or grow together. ? Are painful. ? Are inside your nose or mouth.  You develop a rash that: ? Looks like purple pinprick-sized spots all over your body. ? Has a "bull's eye" or looks like a target. ? Is not related to sun exposure, is red and painful, and causes your skin to peel. This information is not intended to replace advice given to you by your health care provider. Make sure you discuss any questions you have with your health care provider. Document Released: 08/01/2002 Document Revised: 01/15/2016 Document Reviewed: 12/27/2014 Elsevier Interactive Patient Education  2018 [employer]  

## 2018-04-15 NOTE — Progress Notes (Signed)
Subjective:    Patient ID: Jamie Mayer, female    DOB: [birth year], 62 y.o.   MRN: [id number]  No chief complaint on file.   HPI Patient was seen today for acute concern.  Pt endorses pruritic rash on the right leg and right arm x2 weeks.  She does not recall any recent bug bites, changes in soaps or lotions, recent travel, changes in food.  Pt does have a pet cat.  Pt tried cortisone 10 cream with no relief.  Patient requesting a refill on her hemorrhoid medication.  Past Medical History:  Diagnosis Date  . Allergy   . Arthritis   . DUB (dysfunctional uterine bleeding)   . Migraine     Allergies  Allergen Reactions  . Sulfa Antibiotics Nausea And Vomiting    ROS General: Denies fever, chills, night sweats, changes in weight, changes in appetite HEENT: Denies headaches, ear pain, changes in vision, rhinorrhea, sore throat CV: Denies CP, palpitations, SOB, orthopnea Pulm: Denies SOB, cough, wheezing GI: Denies abdominal pain, nausea, vomiting, diarrhea, constipation  +hemorrhoids GU: Denies dysuria, hematuria, frequency, vaginal discharge Msk: Denies muscle cramps, joint pains Neuro: Denies weakness, numbness, tingling Skin: Denies bruising  +rash Psych: Denies depression, anxiety, hallucinations     Objective:    Blood pressure 118/84, pulse 64, temperature 98.7 F (37.1 C), temperature source Oral, weight 185 lb (83.9 kg), last menstrual period 08/20/2011, SpO2 98 %.   Gen. Pleasant, well-nourished, in no distress, normal affect   HEENT: [place]/AT, face symmetric,no scleral icterus Lungs: no accessory muscle use, CTAB, no wheezes or rales Cardiovascular: RRR Neuro:  A&Ox3, CN II-XII intact, normal gait Skin:  Warm, no lesions.  Fine slightly raised erythematous bumps <1 mm in size notes on R lower leg coalescing into an erythematous plaque.  Lesions on medial right thigh.  Smaller plaque on the right elbow.   Wt Readings from Last 3 Encounters:  04/15/18 185 lb  (83.9 kg)  07/13/17 174 lb (78.9 kg)  05/14/17 172 lb (78 kg)    Lab Results  Component Value Date   WBC 4.4 07/13/2017   HGB 13.8 07/13/2017   HCT 41.9 07/13/2017   PLT 301.0 07/13/2017   GLUCOSE 86 07/13/2017   CHOL 180 07/13/2017   TRIG 90.0 07/13/2017   HDL 58.50 07/13/2017   LDLCALC 103 (H) 07/13/2017   ALT 15 07/13/2017   AST 18 07/13/2017   NA 138 07/13/2017   K 3.6 07/13/2017   CL 99 07/13/2017   CREATININE 0.79 07/13/2017   BUN 16 07/13/2017   CO2 33 (H) 07/13/2017   TSH 1.74 07/13/2017   HGBA1C 5.5 03/13/2015   MICROALBUR <0.7 03/13/2015    Assessment/Plan:  Dermatitis -Given handout - Plan: triamcinolone cream (KENALOG) 0.1 %, hydrOXYzine (ATARAX/VISTARIL) 25 MG tablet  Hemorrhoids, unspecified hemorrhoid type  - Plan: hydrocortisone-pramoxine (ANALPRAM HC) 2.5-1 % rectal cream  Follow-up PRN  Abbe AmsterdamShannon Ladon Heney, MD

## 2018-04-28 ENCOUNTER — Ambulatory Visit: Payer: Self-pay | Admitting: Family Medicine

## 2018-04-28 NOTE — Telephone Encounter (Signed)
She called in c/o having blood dripping out of her rectum and having blood clots on the tissue when she wipes.   She first noticed the blood on Saturday.   It just bleeds when she has a BM.  She fainted at 1:30am this morning.   Her husband helped her up.   She didn't even know she had fainted.   No injures sustained per pt.   "I've been feeling lightheaded on and off".    I referred her to the ED.   She then informed me .  "I have an appt with the gastrointestinal doctor now".   "I need to go".   It's with Dr. Elnoria Howard.   Since she was on the way to the GI doctor I instructed her to go there.   I let her know he may send her to the ED.      I routed a note to Dr. Tawanna Cooler so he would be aware of her situation also.  Reason for Disposition . SEVERE rectal bleeding (large blood clots; on and off, or constant bleeding)  Answer Assessment - Initial Assessment Questions 1. APPEARANCE of BLOOD: "What color is it?" "Is it passed separately, on the surface of the stool, or mixed in with the stool?"      I've had blood in my stools for 3 days and again this morning.    I got severe leg cramps while I was sleeping.   I tried to get up and I fainted this morning about 1:30am.   Husband helped me up.   I didn't know I was on the floor.   No injuries when fell. 2. AMOUNT: "How much blood was passed?"      I can see blood dripping from my bottom into the toilet for 3 days. 3. FREQUENCY: "How many times has blood been passed with the stools?"      Every time I go I have blood.   I only go once or twice a day so that is when it happens. 4. ONSET: "When was the blood first seen in the stools?" (Days or weeks)      I wrote it down.   I think it was Saturday I saw it first.   When I wipe I have blood clots. 5. DIARRHEA: "Is there also some diarrhea?" If so, ask: "How many diarrhea stools were passed in past 24 hours?"      No 6. CONSTIPATION: "Do you have constipation?" If so, "How bad is it?"     No.   I'm drinking  water.  At first the stools are hard but then they are soft and come out without problems.    If I push the blood comes.   I'm not straining hard. 7. RECURRENT SYMPTOMS: "Have you had blood in your stools before?" If so, ask: "When was the last time?" and "What happened that time?"      I have hemorrhoids.     8. BLOOD THINNERS: "Do you take any blood thinners?" (e.g., Coumadin/warfarin, Pradaxa/dabigatran, aspirin)     81 mg aspirin every day. 9. OTHER SYMPTOMS: "Do you have any other symptoms?"  (e.g., abdominal pain, vomiting, dizziness, fever)     No abd pain, no vomiting.  I've been feeling light headed like I going to pass out.   It comes and goes.   I notice it when I get up.    101/77 was my BP. 10. PREGNANCY: "Is there any chance you are pregnant?" "When  was your last menstrual period?"       Not asked  Protocols used: RECTAL BLEEDING-A-AH

## 2018-05-28 ENCOUNTER — Ambulatory Visit (INDEPENDENT_AMBULATORY_CARE_PROVIDER_SITE_OTHER): Payer: BC Managed Care – PPO

## 2018-05-28 DIAGNOSIS — Z23 Encounter for immunization: Secondary | ICD-10-CM

## 2018-08-05 ENCOUNTER — Ambulatory Visit: Payer: BC Managed Care – PPO | Admitting: Family Medicine

## 2018-08-16 ENCOUNTER — Other Ambulatory Visit: Payer: Self-pay | Admitting: *Deleted

## 2018-08-16 MED ORDER — LEVOTHYROXINE SODIUM 88 MCG PO TABS: ORAL_TABLET | ORAL | 0 refills | Status: DC

## 2018-08-23 ENCOUNTER — Telehealth: Payer: Self-pay | Admitting: Family Medicine

## 2018-08-23 NOTE — Telephone Encounter (Signed)
[facility]

## 2018-08-26 NOTE — Telephone Encounter (Signed)
Attempted to call patient, but voicemail is not set up yet.  Will try again at another time.

## 2018-08-26 NOTE — Telephone Encounter (Signed)
Pt calling.  States there must have been confusion when she came in and filled out her medical records request form.  Pt states that she was trying to get her records released from [facility] and to go to [facility].

## 2018-08-26 NOTE — Telephone Encounter (Signed)
Spoke with patient and she will come and complete a new medical release form because it was not filled out correctly the first time.

## 2018-09-21 ENCOUNTER — Other Ambulatory Visit: Payer: Self-pay | Admitting: *Deleted

## 2018-09-21 MED ORDER — ATENOLOL-CHLORTHALIDONE 50-25 MG PO TABS: 1.0000 | ORAL_TABLET | Freq: Every day | ORAL | 0 refills | Status: AC

## 2018-11-15 ENCOUNTER — Encounter: Payer: Self-pay | Admitting: Pulmonary Disease

## 2018-11-15 NOTE — Telephone Encounter (Signed)
error 

## 2019-04-26 ENCOUNTER — Other Ambulatory Visit: Payer: Self-pay | Admitting: Family Medicine

## 2019-04-26 DIAGNOSIS — L309 Dermatitis, unspecified: Secondary | ICD-10-CM

## 2019-09-05 ENCOUNTER — Encounter (HOSPITAL_COMMUNITY): Payer: Self-pay | Admitting: Emergency Medicine

## 2019-09-05 ENCOUNTER — Other Ambulatory Visit: Payer: Self-pay

## 2019-09-05 ENCOUNTER — Emergency Department (HOSPITAL_COMMUNITY)
Admission: EM | Admit: 2019-09-05 | Discharge: 2019-09-05 | Disposition: A | Discharge status: Home / Self Care | Payer: BC Managed Care – PPO | Attending: Emergency Medicine | Admitting: Emergency Medicine

## 2019-09-05 ENCOUNTER — Emergency Department (HOSPITAL_COMMUNITY): Payer: BC Managed Care – PPO

## 2019-09-05 DIAGNOSIS — M5441 Lumbago with sciatica, right side: Secondary | ICD-10-CM | POA: Diagnosis not present

## 2019-09-05 DIAGNOSIS — R1319 Other dysphagia: Secondary | ICD-10-CM

## 2019-09-05 DIAGNOSIS — R1310 Dysphagia, unspecified: Secondary | ICD-10-CM | POA: Diagnosis not present

## 2019-09-05 DIAGNOSIS — I10 Essential (primary) hypertension: Secondary | ICD-10-CM | POA: Diagnosis not present

## 2019-09-05 DIAGNOSIS — Z7982 Long term (current) use of aspirin: Secondary | ICD-10-CM | POA: Diagnosis not present

## 2019-09-05 DIAGNOSIS — G8929 Other chronic pain: Secondary | ICD-10-CM | POA: Insufficient documentation

## 2019-09-05 DIAGNOSIS — Z79899 Other long term (current) drug therapy: Secondary | ICD-10-CM | POA: Insufficient documentation

## 2019-09-05 DIAGNOSIS — E039 Hypothyroidism, unspecified: Secondary | ICD-10-CM | POA: Diagnosis not present

## 2019-09-05 HISTORY — DX: Essential (primary) hypertension: I10

## 2019-09-05 HISTORY — DX: Disorder of thyroid, unspecified: E07.9

## 2019-09-05 LAB — CBC WITH DIFFERENTIAL/PLATELET
Abs Immature Granulocytes: 0.02 10*3/uL (ref 0.00–0.07)
Basophils Absolute: 0 10*3/uL (ref 0.0–0.1)
Basophils Relative: 1 %
Eosinophils Absolute: 0.1 10*3/uL (ref 0.0–0.5)
Eosinophils Relative: 1 %
HCT: 40.2 % (ref 36.0–46.0)
Hemoglobin: 13.3 g/dL (ref 12.0–15.0)
Immature Granulocytes: 0 %
Lymphocytes Relative: 34 %
Lymphs Abs: 2.1 10*3/uL (ref 0.7–4.0)
MCH: 28.4 pg (ref 26.0–34.0)
MCHC: 33.1 g/dL (ref 30.0–36.0)
MCV: 85.9 fL (ref 80.0–100.0)
Monocytes Absolute: 0.6 10*3/uL (ref 0.1–1.0)
Monocytes Relative: 9 %
Neutro Abs: 3.3 10*3/uL (ref 1.7–7.7)
Neutrophils Relative %: 55 %
Platelets: 305 10*3/uL (ref 150–400)
RBC: 4.68 MIL/uL (ref 3.87–5.11)
RDW: 15.9 % — ABNORMAL HIGH (ref 11.5–15.5)
WBC: 6.1 10*3/uL (ref 4.0–10.5)
nRBC: 0 % (ref 0.0–0.2)

## 2019-09-05 LAB — BASIC METABOLIC PANEL
Anion gap: 9 (ref 5–15)
BUN: 22 mg/dL (ref 8–23)
CO2: 30 mmol/L (ref 22–32)
Calcium: 9.7 mg/dL (ref 8.9–10.3)
Chloride: 101 mmol/L (ref 98–111)
Creatinine, Ser: 0.9 mg/dL (ref 0.44–1.00)
GFR calc Af Amer: >60 mL/min (ref >60)
GFR calc non Af Amer: >60 mL/min (ref >60)
Glucose, Bld: 101 mg/dL — ABNORMAL HIGH (ref 70–99)
Potassium: 3.4 mmol/L — ABNORMAL LOW (ref 3.5–5.1)
Sodium: 140 mmol/L (ref 135–145)

## 2019-09-05 LAB — URINALYSIS, ROUTINE W REFLEX MICROSCOPIC
Bilirubin Urine: NEGATIVE
Glucose, UA: NEGATIVE mg/dL
Hgb urine dipstick: NEGATIVE
Ketones, ur: NEGATIVE mg/dL
Leukocytes,Ua: NEGATIVE
Nitrite: NEGATIVE
Protein, ur: NEGATIVE mg/dL
Specific Gravity, Urine: 1.002 — ABNORMAL LOW (ref 1.005–1.030)
pH: 8 (ref 5.0–8.0)

## 2019-09-05 LAB — TROPONIN I (HIGH SENSITIVITY): Troponin I (High Sensitivity): <2 ng/L (ref <18)

## 2019-09-05 MED ORDER — KETOROLAC TROMETHAMINE 30 MG/ML IJ SOLN
15.0000 mg | Freq: Once | INTRAMUSCULAR | Status: AC
  Administered 2019-09-05: 15 mg via INTRAVENOUS
  Filled 2019-09-05: qty 1

## 2019-09-05 MED ORDER — KETOROLAC TROMETHAMINE 60 MG/2ML IM SOLN: 30.0000 mg | Freq: Once | INTRAMUSCULAR | Status: DC

## 2019-09-05 MED ORDER — PANTOPRAZOLE SODIUM 20 MG PO TBEC: 20.0000 mg | DELAYED_RELEASE_TABLET | Freq: Every day | ORAL | 0 refills | Status: AC

## 2019-09-05 MED ORDER — OXYCODONE-ACETAMINOPHEN 5-325 MG PO TABS
1.0000 | ORAL_TABLET | Freq: Once | ORAL | Status: AC
  Administered 2019-09-05: 1 via ORAL
  Filled 2019-09-05: qty 1

## 2019-09-05 NOTE — ED Provider Notes (Signed)
Emergency Department Provider Note   I have reviewed the triage vital signs and the nursing notes.   HISTORY  Chief Complaint Hypertension   HPI Jamie Mayer is a 63 y.o. female ultimately presents the emergency department today for hypertension.  Patient states that she has been having some chronic worsening back pain bilateral lower back that radiates down to her right gluteal area and the upper right thigh.  She seen orthopedics for this tomorrow.  States the pain is been progressively worsening and she was having that pain yesterday.  She took some Tylenol for it which did not help much and then she checked her blood pressure and it was elevated.  She is not sure what it was then.  Tonight she had an episode where she was eating and she had trouble swallowing.  She was eating chicken and it would get down to her sternal area and she felt like it would get stuck in take a long time to move into her stomach which cause discomfort.  This happened 3 times so she checked her blood pressure and it started at 177 systolic over 104 diastolic.  She checked it multiple times every few minutes and got as high as 203 systolic over 114 diastolic.  She has no nausea, vomiting, diaphoresis, lightheadedness, weakness, shortness of breath, change in urination, leg swelling or abdominal pain.   No other associated or modifying symptoms.    Past Medical History:  Diagnosis Date  . Allergy   . Arthritis   . DUB (dysfunctional uterine bleeding)   . Hypertension   . Migraine   . Thyroid disease     Patient Active Problem List   Diagnosis Date Noted  . Obesity 06/13/2015  . Essential hypertension 08/29/2008  . ALLERGIC RHINITIS 06/08/2008  . Hypothyroidism 11/05/2007    Past Surgical History:  Procedure Laterality Date  . TONSILLECTOMY    . TUBAL LIGATION      Current Outpatient Rx  . Order #: [id number] Class: Historical Med  . Order #: [id number] Class: Normal  . Order #:  [id number] Class: Historical Med  . Order #: [id number] Class: Normal  . Order #: [id number] Class: Normal  . Order #: [id number] Class: Normal  . Order #: [id number] Class: Normal  . Order #: [id number] Class: Historical Med  . Order #: [id number] Class: Normal  . Order #: [id number] Class: Normal  . Order #: [id number] Class: Historical Med    Allergies Sulfa antibiotics  Family History  Problem Relation Age of Onset  . Kidney disease Mother   . Alcohol abuse Father   . Hypertension Sister   . Alcohol abuse Brother   . Hypertension Brother   . Hypertension Sister     Social History Social History   Tobacco Use  . Smoking status: Never Smoker  . Smokeless tobacco: Never Used  Substance Use Topics  . Alcohol use: No  . Drug use: No    Review of Systems  All other systems negative except as documented in the HPI. All pertinent positives and negatives as reviewed in the HPI. ____________________________________________   PHYSICAL EXAM:  VITAL SIGNS: ED Triage Vitals  Enc Vitals Group     BP 09/05/19 0015 (!) 189/111     Pulse Rate 09/05/19 0015 73     Resp 09/05/19 0015 19     Temp 09/05/19 0015 98 F (36.7 C)     Temp Source 09/05/19 0015 Oral     SpO2 09/05/19 0015 100 %     Weight 09/05/19 0016 180 lb (  81.6 kg)     Height 09/05/19 0016 5\' 5"  (1.651 m)     Head Circumference --      Peak Flow --      Pain Score 09/05/19 0016 2     Pain Loc --      Pain Edu? --      Excl. in [place]? --     Constitutional: Alert and oriented. Well appearing and in no acute distress. Eyes: Conjunctivae are normal. PERRL. EOMI. Head: Atraumatic. Nose: No congestion/rhinnorhea. Mouth/Throat: Mucous membranes are moist.  Oropharynx non-erythematous. Neck: No stridor.  No meningeal signs.   Cardiovascular: Normal rate, regular rhythm. Good peripheral circulation. Grossly normal heart sounds.   Respiratory: Normal respiratory effort.  No retractions. Lungs CTAB. Gastrointestinal: Soft and  nontender. No distention.  Musculoskeletal: No lower extremity tenderness nor edema. No gross deformities of extremities. Neurologic:  Normal speech and language. No gross focal neurologic deficits are appreciated.  Skin:  Skin is warm, dry and intact. No rash noted.  ____________________________________________   LABS (all labs ordered are listed, but only abnormal results are displayed)  Labs Reviewed  CBC WITH DIFFERENTIAL/PLATELET - Abnormal; Notable for the following components:      Result Value   RDW 15.9 (*)    All other components within normal limits  BASIC METABOLIC PANEL - Abnormal; Notable for the following components:   Potassium 3.4 (*)    Glucose, Bld 101 (*)    All other components within normal limits  URINALYSIS, ROUTINE W REFLEX MICROSCOPIC - Abnormal; Notable for the following components:   Color, Urine COLORLESS (*)    Specific Gravity, Urine 1.002 (*)    All other components within normal limits  TROPONIN I (HIGH SENSITIVITY)   ____________________________________________  EKG   EKG Interpretation  Date/Time:  Monday September 05 2019 00:18:00 EST Ventricular Rate:  64 PR Interval:    QRS Duration: 88 QT Interval:  399 QTC Calculation: 412 R Axis:   49 Text Interpretation: Sinus rhythm Probable left atrial enlargement No significant change since last tracing Confirmed by Merrily Pew [phone]) on 09/05/2019 12:43:57 AM       ____________________________________________  RADIOLOGY  DG Chest Portable 1 View  Result Date: 09/05/2019 CLINICAL DATA:  Chest pain EXAM: PORTABLE CHEST 1 VIEW COMPARISON:  August 30, 2016 FINDINGS: The heart size and mediastinal contours are within normal limits. Both lungs are clear. The visualized skeletal structures are unremarkable. IMPRESSION: No active disease. Electronically Signed   By: Prudencio Pair M.D.   On: 09/05/2019 01:12    ____________________________________________   PROCEDURES  Procedure(s)  performed:   Procedures   ____________________________________________   INITIAL IMPRESSION / ASSESSMENT AND PLAN / ED COURSE  I suspect her symptoms tonight of chest pain are probably more related to some esophageal stricture may be from longstanding indigestion however she denies any known history of it.  Secondary to her age and her blood pressure will check for cardiac damage.  Consider considered possible aortic injury however aorta was normal on ultrasound a year ago and she does not have any abdominal pain, pulsating mass or any other indication to evaluate that further.  I think her back pain is separate and is likely one of the causes of her elevated blood pressure.   bp's slowly improving. No e/o end organ damage. Symptoms controlled or will be followed by outpatient appointments.   Pertinent labs & imaging results that were available during my care of the patient were reviewed by me and  considered in my medical decision making (see chart for details).  A medical screening exam was performed and I feel the patient has had an appropriate workup for their chief complaint at this time and likelihood of emergent condition existing is low. They have been counseled on decision, discharge, follow up and which symptoms necessitate immediate return to the emergency department. They or their family verbally stated understanding and agreement with plan and discharged in stable condition.   ____________________________________________  FINAL CLINICAL IMPRESSION(S) / ED DIAGNOSES  Final diagnoses:  Hypertension, unspecified type  Chronic bilateral low back pain with right-sided sciatica  Esophageal dysphagia     MEDICATIONS GIVEN DURING THIS VISIT:  Medications  oxyCODONE-acetaminophen (PERCOCET/ROXICET) 5-325 MG per tablet 1 tablet (1 tablet Oral Given 09/05/19 0148)  ketorolac (TORADOL) 30 MG/ML injection 15 mg (15 mg Intravenous Given 09/05/19 0147)     NEW OUTPATIENT MEDICATIONS  STARTED DURING THIS VISIT:  Discharge Medication List as of 09/05/2019  2:24 AM    START taking these medications   Details  pantoprazole (PROTONIX) 20 MG tablet Take 1 tablet (20 mg total) by mouth daily., Starting Mon 09/05/2019, Normal        Note:  This note was prepared with assistance of Dragon voice recognition software. Occasional wrong-word or sound-a-like substitutions may have occurred due to the inherent limitations of voice recognition software.   Elieser Tetrick, Dynasty Cower, MD 09/05/19 [phone]

## 2019-09-05 NOTE — ED Triage Notes (Signed)
Pt reports having elevated blood pressure for the last 2 days and has been having back pain for last 2 weeks.

## 2019-09-05 NOTE — ED Notes (Signed)
EKG given to EDP,Mesner for review.

## 2019-11-10 ENCOUNTER — Ambulatory Visit: Payer: BC Managed Care – PPO | Attending: Internal Medicine

## 2019-11-10 DIAGNOSIS — Z23 Encounter for immunization: Secondary | ICD-10-CM

## 2019-11-10 NOTE — Progress Notes (Signed)
   Covid-19 Vaccination Clinic  Name:  Jamie Mayer    MRN: [id number] DOB: [birth year]  11/10/2019  Jamie Mayer was observed post Covid-19 immunization for 15 minutes without incident. She was provided with Vaccine Information Sheet and instruction to access the V-Safe system.   Jamie Mayer was instructed to call 911 with any severe reactions post vaccine: Marland Kitchen Difficulty breathing  . Swelling of face and throat  . A fast heartbeat  . A bad rash all over body  . Dizziness and weakness   Immunizations Administered    Name Date Dose VIS Date Route   Pfizer COVID-19 Vaccine 11/10/2019  9:38 AM 0.3 mL 08/05/2019 Intramuscular   Manufacturer: [employer], [employer]   Lot: [id number]   NDC: [address]-0

## 2019-12-07 ENCOUNTER — Ambulatory Visit: Payer: BC Managed Care – PPO | Attending: Internal Medicine

## 2019-12-07 ENCOUNTER — Ambulatory Visit: Payer: Self-pay

## 2019-12-07 DIAGNOSIS — Z23 Encounter for immunization: Secondary | ICD-10-CM

## 2019-12-07 NOTE — Telephone Encounter (Signed)
Incoming call from Patient stating that she just returned fro receiving 2nd dose of covid vaccine.  Patient states that she felt a spray sensation as the needle was coming out. Wants to know if she got all the dosage. Reassured patient that she did. Voiced understanding.

## 2019-12-07 NOTE — Progress Notes (Signed)
   Covid-19 Vaccination Clinic  Name:  Jamie Mayer    MRN: [id number] DOB: 01-11-1956  12/07/2019  [name] was observed post Covid-19 immunization for 15 minutes without incident. She was provided with Vaccine Information Sheet and instruction to access the V-Safe system.   [name] was instructed to call 911 with any severe reactions post vaccine: Marland Kitchen Difficulty breathing  . Swelling of face and throat  . A fast heartbeat  . A bad rash all over body  . Dizziness and weakness   Immunizations Administered    Name Date Dose VIS Date Route   Pfizer COVID-19 Vaccine 12/07/2019  8:10 AM 0.3 mL 08/05/2019 Intramuscular   Manufacturer: [employer], [employer]   Lot: [id number]   NDC: [address]-4

## 2021-05-30 ENCOUNTER — Other Ambulatory Visit: Payer: Self-pay | Admitting: Physician Assistant

## 2021-05-30 DIAGNOSIS — R2689 Other abnormalities of gait and mobility: Secondary | ICD-10-CM

## 2021-06-20 ENCOUNTER — Ambulatory Visit
Admission: RE | Admit: 2021-06-20 | Discharge: 2021-06-20 | Disposition: A | Discharge status: Home / Self Care | Payer: BC Managed Care – PPO | Source: Ambulatory Visit | Attending: Physician Assistant | Admitting: Physician Assistant

## 2021-06-20 DIAGNOSIS — R2689 Other abnormalities of gait and mobility: Secondary | ICD-10-CM

## 2021-08-01 ENCOUNTER — Encounter: Payer: Self-pay | Admitting: *Deleted

## 2021-08-01 ENCOUNTER — Other Ambulatory Visit: Payer: Self-pay | Admitting: *Deleted

## 2021-08-06 ENCOUNTER — Ambulatory Visit: Payer: BC Managed Care – PPO | Admitting: Diagnostic Neuroimaging

## 2021-10-16 ENCOUNTER — Ambulatory Visit: Payer: BC Managed Care – PPO | Admitting: Diagnostic Neuroimaging

## 2021-10-29 ENCOUNTER — Ambulatory Visit: Payer: BC Managed Care – PPO | Admitting: Diagnostic Neuroimaging

## 2021-10-29 ENCOUNTER — Encounter: Payer: Self-pay | Admitting: Diagnostic Neuroimaging

## 2021-10-29 VITALS — BP 119/78 | HR 66 | Ht 63.0 in | Wt 182.0 lb

## 2021-10-29 DIAGNOSIS — G251 Drug-induced tremor: Secondary | ICD-10-CM | POA: Diagnosis not present

## 2021-10-29 NOTE — Progress Notes (Signed)
? ?[facility] ? ?PATIENT: Jamie Mayer ?DOB: Apr 08, 1956 ? ?REFERRING CLINICIAN: Lennie Odor, PA ?HISTORY FROM: PATIENT  ?REASON FOR VISIT:  NEW CONSULT ? ? ?HISTORICAL ? ?CHIEF COMPLAINT:  ?Chief Complaint  ?Patient presents with  ? Shaking, balance issue  ?  Rm 6 New Pt "when I get anxious my head shakes; but it is better than it was"   ? ? ?HISTORY OF PRESENT ILLNESS:  ? ?66 year old female here for evaluation of tremor and gait difficulty.  Mild postural tremor, worse with anxiety.  Symptoms started around September 2022.  Around that time her TSH was low and potassium was low.  Medication adjustments were made.  Symptoms gradually improved.  In the meantime she had a CT of the head which was unremarkable.  Her tremor and gait difficulty have improved. ? ?REVIEW OF SYSTEMS: Full 14 system review of systems performed and negative with exception of: As per HPI. ? ?ALLERGIES: ?Allergies  ?Allergen Reactions  ? Sulfa Antibiotics Nausea And Vomiting  ? ? ?HOME MEDICATIONS: ?Outpatient Medications Prior to Visit  ?Medication Sig Dispense Refill  ? aspirin 81 MG EC tablet Take 81 mg by mouth daily.      ? atenolol-chlorthalidone (TENORETIC) 50-25 MG tablet Take 1 tablet by mouth daily. 90 tablet 0  ? calcium citrate-vitamin D 500-400 MG-UNIT chewable tablet Chew 1 tablet by mouth 3 (three) times daily.     ? cyclobenzaprine (FLEXERIL) 10 MG tablet Take 1 tablet (10 mg total) by mouth 3 (three) times daily as needed for muscle spasms. 60 tablet 0  ? hydrocortisone-pramoxine (ANALPRAM HC) 2.5-1 % rectal cream Apply daily at bedtime when necessary 60 g 1  ? hydrOXYzine (ATARAX/VISTARIL) 25 MG tablet Take 1 tablet (25 mg total) by mouth 3 (three) times daily as needed. 20 tablet 0  ? levothyroxine (SYNTHROID) 75 MCG tablet Take 75 mcg by mouth daily.    ? Multiple Vitamin (MULITIVITAMIN WITH MINERALS) TABS Take by mouth daily.    ? pantoprazole (PROTONIX) 20 MG tablet Take 1 tablet (20 mg total)  by mouth daily. 30 tablet 0  ? potassium chloride (KLOR-CON) 10 MEQ tablet Take 10 mEq by mouth daily.    ? triamcinolone cream (KENALOG) 0.1 % Apply 1 application topically 2 (two) times daily. 30 g 0  ? vitamin C (ASCORBIC ACID) 500 MG tablet Take 500 mg by mouth daily.    ? ?No facility-administered medications prior to visit.  ? ? ?PAST MEDICAL HISTORY: ?Past Medical History:  ?Diagnosis Date  ? Allergy   ? Arthritis   ? DUB (dysfunctional uterine bleeding)   ? Eczema   ? Hypertension   ? Migraine   ? Osteoporosis   ? Thyroid disease   ? ? ?PAST SURGICAL HISTORY: ?Past Surgical History:  ?Procedure Laterality Date  ? fibroidectomy  2006  ? keloid surgery  2008  ? TONSILLECTOMY    ? TUBAL LIGATION  1997  ? ? ?FAMILY HISTORY: ?Family History  ?Problem Relation Age of Onset  ? Hypertension Mother   ? Kidney disease Mother   ? Hypertension Father   ? Alcohol abuse Father   ? Hypertension Sister   ? Hypertension Sister   ? Alcohol abuse Brother   ? Hypertension Brother   ? Diabetes Maternal Aunt   ? Epilepsy Maternal Uncle   ? ? ?SOCIAL HISTORY: ?Social History  ? ?Socioeconomic History  ? Marital status: Married  ?  Spouse name: Percell Miller  ? Number of children: 2  ?  Years of education: college grad  ? Highest education level: Not on file  ?Occupational History  ?  Comment: [employer]  ?  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 Smoking status: Never  ? Smokeless tobacco: Never  ?Substance and Sexual Activity  ? Alcohol use: No  ? Drug use: No  ? Sexual activity: Not on file  ?Other Topics Concern  ? Not on file  ?Social History Narrative  ? Lives with husband  ? Caffeine 3 a week  ? ?Social Determinants of Health  ? ?Financial Resource Strain: Not on file  ?Food Insecurity: Not on file  ?Transportation Needs: Not on file  ?Physical Activity: Not on file  ?Stress: Not on file  ?Social Connections: Not on file  ?Intimate Partner Violence: Not on file  ? ? ? ?PHYSICAL EXAM ? ?GENERAL  EXAM/CONSTITUTIONAL: ?Vitals:  ?Vitals:  ? 10/29/21 1334  ?BP: 119/78  ?Pulse: 66  ?Weight: 182 lb (82.6 kg)  ?Height: 5\' 3"  (1.6 m)  ? ?Body mass index is 32.24 kg/m?. ?Wt Readings from Last 3 Encounters:  ?10/29/21 182 lb (82.6 kg)  ?09/05/19 180 lb (81.6 kg)  ?04/15/18 185 lb (83.9 kg)  ? ?Patient is in no distress; well developed, nourished and groomed; neck is supple ? ?CARDIOVASCULAR: ?Examination of carotid arteries is normal; no carotid bruits ?Regular rate and rhythm, no murmurs ?Examination of peripheral vascular system by observation and palpation is normal ? ?EYES: ?Ophthalmoscopic exam of optic discs and posterior segments is normal; no papilledema or hemorrhages ?No results found. ? ?MUSCULOSKELETAL: ?Gait, strength, tone, movements noted in Neurologic exam below ? ?NEUROLOGIC: ?MENTAL STATUS:  ?No flowsheet data found. ?awake, alert, oriented to person, place and time ?recent and remote memory intact ?normal attention and concentration ?language fluent, comprehension intact, naming intact ?fund of knowledge appropriate ? ?CRANIAL NERVE:  ?2nd - no papilledema on fundoscopic exam ?2nd, 3rd, 4th, 6th - pupils equal and reactive to light, visual fields full to confrontation, extraocular muscles intact, no nystagmus ?5th - facial sensation symmetric ?7th - facial strength symmetric ?8th - hearing intact ?9th - palate elevates symmetrically, uvula midline ?11th - shoulder shrug symmetric ?12th - tongue protrusion midline ? ?MOTOR:  ?normal bulk and tone, full strength in the BUE, BLE ? ?SENSORY:  ?normal and symmetric to light touch, temperature, vibration ? ?COORDINATION:  ?finger-nose-finger, fine finger movements normal ? ?REFLEXES:  ?deep tendon reflexes present and symmetric ? ?GAIT/STATION:  ?narrow based gait; ROMBERG NEGATIVE ? ? ? ? ?DIAGNOSTIC DATA (LABS, IMAGING, TESTING) ?- I reviewed patient records, labs, notes, testing and imaging myself where available. ? ?Lab Results  ?Component Value Date   ? WBC 6.1 09/05/2019  ? HGB 13.3 09/05/2019  ? HCT 40.2 09/05/2019  ? MCV 85.9 09/05/2019  ? PLT 305 09/05/2019  ? ?   ?Component Value Date/Time  ? NA 140 09/05/2019 0058  ? K 3.4 (L) 09/05/2019 0058  ? CL 101 09/05/2019 0058  ? CO2 30 09/05/2019 0058  ? GLUCOSE 101 (H) 09/05/2019 0058  ? BUN 22 09/05/2019 0058  ? CREATININE 0.90 09/05/2019 0058  ? CALCIUM 9.7 09/05/2019 0058  ? PROT 6.5 07/13/2017 0923  ? ALBUMIN 3.7 07/13/2017 0923  ? AST 18 07/13/2017 0923  ? ALT 15 07/13/2017 0923  ? ALKPHOS 80 07/13/2017 0923  ? BILITOT 0.6 07/13/2017 0923  ? GFRNONAA >60 09/05/2019 0058  ? GFRAA >60 09/05/2019 0058  ? ?Lab Results  ?Component Value Date  ?  CHOL 180 07/13/2017  ? HDL 58.50 07/13/2017  ? LDLCALC 103 (H) 07/13/2017  ? TRIG 90.0 07/13/2017  ? CHOLHDL 3 07/13/2017  ? ?Lab Results  ?Component Value Date  ? HGBA1C 5.5 03/13/2015  ? ?No results found for: VITAMINB12 ?Lab Results  ?Component Value Date  ? TSH 1.74 07/13/2017  ? ? ?06/20/21 CT head [I reviewed images myself and agree with interpretation. -VRP]  ?- Unremarkable non-contrast CT appearance of the brain for age. No ?evidence of acute intracranial abnormality. ? 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 ? ? ? ?ASSESSMENT AND PLAN ? ?66 y.o. year old female here with: ? ? ?Dx: ? ?1. Medication-induced postural tremor   ? ? ? ?PLAN: ? ?PRIOR TREMOR / BALANCE ISSUES (in setting of low TSH and low potassium and some anxiety issues; now greatly improved) ?- symptoms are now resolved; monitor ? ?Return for return to PCP, pending if symptoms worsen or fail to improve. ? ? ? ?Penni Bombard, MD [id number], [id number] PM ?Certified in Neurology, Neurophysiology and Neuroimaging ? ?[facility] ?[address], [address] ?[place], [place] [address] ?[phone] ?

## 2022-04-16 ENCOUNTER — Ambulatory Visit
Admission: RE | Admit: 2022-04-16 | Discharge: 2022-04-16 | Disposition: A | Discharge status: Home / Self Care | Payer: BC Managed Care – PPO | Source: Ambulatory Visit | Attending: Gastroenterology | Admitting: Gastroenterology

## 2022-04-16 ENCOUNTER — Other Ambulatory Visit: Payer: Self-pay | Admitting: Gastroenterology

## 2022-04-16 DIAGNOSIS — J029 Acute pharyngitis, unspecified: Secondary | ICD-10-CM

## 2022-12-31 ENCOUNTER — Encounter: Payer: Self-pay | Admitting: Physician Assistant

## 2022-12-31 ENCOUNTER — Other Ambulatory Visit: Payer: Self-pay | Admitting: Physician Assistant

## 2022-12-31 DIAGNOSIS — R229 Localized swelling, mass and lump, unspecified: Secondary | ICD-10-CM

## 2023-01-22 ENCOUNTER — Ambulatory Visit
Admission: RE | Admit: 2023-01-22 | Discharge: 2023-01-22 | Disposition: A | Discharge status: Home / Self Care | Payer: BC Managed Care – PPO | Source: Ambulatory Visit | Attending: Physician Assistant | Admitting: Physician Assistant

## 2023-01-22 DIAGNOSIS — R229 Localized swelling, mass and lump, unspecified: Secondary | ICD-10-CM

## 2023-04-02 DIAGNOSIS — R2232 Localized swelling, mass and lump, left upper limb: Secondary | ICD-10-CM | POA: Diagnosis not present

## 2023-04-06 DIAGNOSIS — D1801 Hemangioma of skin and subcutaneous tissue: Secondary | ICD-10-CM | POA: Diagnosis not present

## 2023-04-06 DIAGNOSIS — R2232 Localized swelling, mass and lump, left upper limb: Secondary | ICD-10-CM | POA: Diagnosis not present

## 2023-07-15 DIAGNOSIS — I10 Essential (primary) hypertension: Secondary | ICD-10-CM | POA: Diagnosis not present

## 2023-07-15 DIAGNOSIS — Z23 Encounter for immunization: Secondary | ICD-10-CM | POA: Diagnosis not present

## 2023-07-15 DIAGNOSIS — M810 Age-related osteoporosis without current pathological fracture: Secondary | ICD-10-CM | POA: Diagnosis not present

## 2023-07-15 DIAGNOSIS — Z1159 Encounter for screening for other viral diseases: Secondary | ICD-10-CM | POA: Diagnosis not present

## 2023-07-15 DIAGNOSIS — Z77011 Contact with and (suspected) exposure to lead: Secondary | ICD-10-CM | POA: Diagnosis not present

## 2023-07-15 DIAGNOSIS — Z0000 Encounter for general adult medical examination without abnormal findings: Secondary | ICD-10-CM | POA: Diagnosis not present

## 2023-07-15 DIAGNOSIS — E7800 Pure hypercholesterolemia, unspecified: Secondary | ICD-10-CM | POA: Diagnosis not present

## 2023-07-15 DIAGNOSIS — L309 Dermatitis, unspecified: Secondary | ICD-10-CM | POA: Diagnosis not present

## 2023-07-15 DIAGNOSIS — E039 Hypothyroidism, unspecified: Secondary | ICD-10-CM | POA: Diagnosis not present

## 2023-07-30 DIAGNOSIS — L718 Other rosacea: Secondary | ICD-10-CM | POA: Diagnosis not present

## 2023-07-30 DIAGNOSIS — L910 Hypertrophic scar: Secondary | ICD-10-CM | POA: Diagnosis not present

## 2023-07-30 DIAGNOSIS — L853 Xerosis cutis: Secondary | ICD-10-CM | POA: Diagnosis not present

## 2023-07-30 DIAGNOSIS — L821 Other seborrheic keratosis: Secondary | ICD-10-CM | POA: Diagnosis not present

## 2023-07-30 DIAGNOSIS — L603 Nail dystrophy: Secondary | ICD-10-CM | POA: Diagnosis not present

## 2023-08-10 DIAGNOSIS — E876 Hypokalemia: Secondary | ICD-10-CM | POA: Diagnosis not present

## 2023-08-27 DIAGNOSIS — D1800 Hemangioma unspecified site: Secondary | ICD-10-CM | POA: Diagnosis not present

## 2023-09-02 DIAGNOSIS — E876 Hypokalemia: Secondary | ICD-10-CM | POA: Diagnosis not present

## 2023-09-17 DIAGNOSIS — D1800 Hemangioma unspecified site: Secondary | ICD-10-CM | POA: Diagnosis not present

## 2023-09-18 DIAGNOSIS — K649 Unspecified hemorrhoids: Secondary | ICD-10-CM | POA: Diagnosis not present

## 2023-09-21 ENCOUNTER — Other Ambulatory Visit: Payer: Self-pay | Admitting: Obstetrics and Gynecology

## 2023-09-21 DIAGNOSIS — N644 Mastodynia: Secondary | ICD-10-CM

## 2023-09-23 DIAGNOSIS — Q279 Congenital malformation of peripheral vascular system, unspecified: Secondary | ICD-10-CM | POA: Diagnosis not present

## 2023-09-23 DIAGNOSIS — D1801 Hemangioma of skin and subcutaneous tissue: Secondary | ICD-10-CM | POA: Diagnosis not present

## 2023-09-23 DIAGNOSIS — R2232 Localized swelling, mass and lump, left upper limb: Secondary | ICD-10-CM | POA: Diagnosis not present

## 2023-09-29 ENCOUNTER — Other Ambulatory Visit: Payer: Self-pay

## 2023-10-02 ENCOUNTER — Encounter: Payer: Self-pay | Admitting: Obstetrics and Gynecology

## 2023-10-06 ENCOUNTER — Ambulatory Visit
Admission: RE | Admit: 2023-10-06 | Discharge: 2023-10-06 | Disposition: A | Discharge status: Home / Self Care | Payer: Medicare Other | Source: Ambulatory Visit | Attending: Obstetrics and Gynecology | Admitting: Obstetrics and Gynecology

## 2023-10-06 ENCOUNTER — Ambulatory Visit: Payer: Self-pay

## 2023-10-06 DIAGNOSIS — E876 Hypokalemia: Secondary | ICD-10-CM | POA: Diagnosis not present

## 2023-10-06 DIAGNOSIS — N644 Mastodynia: Secondary | ICD-10-CM

## 2023-10-06 DIAGNOSIS — R92322 Mammographic fibroglandular density, left breast: Secondary | ICD-10-CM | POA: Diagnosis not present

## 2023-10-07 ENCOUNTER — Other Ambulatory Visit: Payer: Self-pay

## 2023-10-15 DIAGNOSIS — M65332 Trigger finger, left middle finger: Secondary | ICD-10-CM | POA: Diagnosis not present

## 2023-10-15 DIAGNOSIS — Q279 Congenital malformation of peripheral vascular system, unspecified: Secondary | ICD-10-CM | POA: Diagnosis not present

## 2023-11-06 DIAGNOSIS — E875 Hyperkalemia: Secondary | ICD-10-CM | POA: Diagnosis not present

## 2023-11-06 DIAGNOSIS — E876 Hypokalemia: Secondary | ICD-10-CM | POA: Diagnosis not present

## 2023-11-26 DIAGNOSIS — E876 Hypokalemia: Secondary | ICD-10-CM | POA: Diagnosis not present

## 2023-11-26 DIAGNOSIS — Q279 Congenital malformation of peripheral vascular system, unspecified: Secondary | ICD-10-CM | POA: Diagnosis not present

## 2023-11-26 DIAGNOSIS — M65332 Trigger finger, left middle finger: Secondary | ICD-10-CM | POA: Diagnosis not present

## 2024-01-07 DIAGNOSIS — Q279 Congenital malformation of peripheral vascular system, unspecified: Secondary | ICD-10-CM | POA: Diagnosis not present

## 2024-01-07 DIAGNOSIS — M65332 Trigger finger, left middle finger: Secondary | ICD-10-CM | POA: Diagnosis not present

## 2024-01-11 DIAGNOSIS — E039 Hypothyroidism, unspecified: Secondary | ICD-10-CM | POA: Diagnosis not present

## 2024-01-11 DIAGNOSIS — M545 Low back pain, unspecified: Secondary | ICD-10-CM | POA: Diagnosis not present

## 2024-01-11 DIAGNOSIS — I10 Essential (primary) hypertension: Secondary | ICD-10-CM | POA: Diagnosis not present

## 2024-01-12 DIAGNOSIS — Q279 Congenital malformation of peripheral vascular system, unspecified: Secondary | ICD-10-CM | POA: Diagnosis not present

## 2024-01-19 DIAGNOSIS — H109 Unspecified conjunctivitis: Secondary | ICD-10-CM | POA: Diagnosis not present

## 2024-01-21 DIAGNOSIS — Z1231 Encounter for screening mammogram for malignant neoplasm of breast: Secondary | ICD-10-CM | POA: Diagnosis not present

## 2024-01-30 DIAGNOSIS — M65332 Trigger finger, left middle finger: Secondary | ICD-10-CM | POA: Diagnosis not present

## 2024-01-30 DIAGNOSIS — M65132 Other infective (teno)synovitis, left wrist: Secondary | ICD-10-CM | POA: Diagnosis not present

## 2024-01-30 DIAGNOSIS — M67432 Ganglion, left wrist: Secondary | ICD-10-CM | POA: Diagnosis not present

## 2024-01-30 DIAGNOSIS — M65932 Unspecified synovitis and tenosynovitis, left forearm: Secondary | ICD-10-CM | POA: Diagnosis not present

## 2024-01-30 DIAGNOSIS — Q279 Congenital malformation of peripheral vascular system, unspecified: Secondary | ICD-10-CM | POA: Diagnosis not present

## 2024-02-02 DIAGNOSIS — Q279 Congenital malformation of peripheral vascular system, unspecified: Secondary | ICD-10-CM | POA: Diagnosis not present

## 2024-02-05 DIAGNOSIS — L821 Other seborrheic keratosis: Secondary | ICD-10-CM | POA: Diagnosis not present

## 2024-02-05 DIAGNOSIS — L910 Hypertrophic scar: Secondary | ICD-10-CM | POA: Diagnosis not present

## 2024-02-05 DIAGNOSIS — L718 Other rosacea: Secondary | ICD-10-CM | POA: Diagnosis not present

## 2024-05-10 DIAGNOSIS — M8588 Other specified disorders of bone density and structure, other site: Secondary | ICD-10-CM | POA: Diagnosis not present

## 2024-05-12 DIAGNOSIS — L819 Disorder of pigmentation, unspecified: Secondary | ICD-10-CM | POA: Diagnosis not present

## 2024-05-12 DIAGNOSIS — L910 Hypertrophic scar: Secondary | ICD-10-CM | POA: Diagnosis not present

## 2024-05-12 DIAGNOSIS — L719 Rosacea, unspecified: Secondary | ICD-10-CM | POA: Diagnosis not present

## 2024-05-12 DIAGNOSIS — L853 Xerosis cutis: Secondary | ICD-10-CM | POA: Diagnosis not present

## 2024-05-14 DIAGNOSIS — L910 Hypertrophic scar: Secondary | ICD-10-CM | POA: Diagnosis not present

## 2024-09-30 ENCOUNTER — Encounter: Payer: Self-pay | Admitting: *Deleted

## 2024-09-30 LAB — AMB RESULTS CONSOLE CBG: Glucose: 110

## 2024-09-30 NOTE — Progress Notes (Signed)
 Pt attended 09/30/24 screening event where her blood pressure was 140/92 and her blood glucose was 110. Pt did not document having a PCP and listed her insurance as Medicaid. Pt noted that she is not an active smoker.
